# Patient Record
Sex: Female | Born: 1990 | Race: Black or African American | Hispanic: No | Marital: Single | Smoking: Current every day smoker
Health system: Southern US, Community
[De-identification: ages and names within clinical notes are randomized; demographics above are authoritative.]

## PROBLEM LIST (undated history)

## (undated) ENCOUNTER — Inpatient Hospital Stay (HOSPITAL_COMMUNITY): Payer: Self-pay

## (undated) DIAGNOSIS — N390 Urinary tract infection, site not specified: Secondary | ICD-10-CM

## (undated) DIAGNOSIS — R000 Tachycardia, unspecified: Secondary | ICD-10-CM

## (undated) DIAGNOSIS — D649 Anemia, unspecified: Secondary | ICD-10-CM

## (undated) DIAGNOSIS — R51 Headache: Secondary | ICD-10-CM

## (undated) DIAGNOSIS — E669 Obesity, unspecified: Secondary | ICD-10-CM

## (undated) DIAGNOSIS — K5900 Constipation, unspecified: Secondary | ICD-10-CM

## (undated) HISTORY — PX: FOOT SURGERY: SHX648

## (undated) HISTORY — PX: TONSILLECTOMY: SUR1361

---
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
## 2012-03-11 ENCOUNTER — Emergency Department (HOSPITAL_BASED_OUTPATIENT_CLINIC_OR_DEPARTMENT_OTHER)
Admission: EM | Admit: 2012-03-11 | Discharge: 2012-03-11 | Disposition: A | Discharge status: Home / Self Care | Payer: Self-pay | Attending: Emergency Medicine | Admitting: Emergency Medicine

## 2012-03-11 ENCOUNTER — Encounter (HOSPITAL_BASED_OUTPATIENT_CLINIC_OR_DEPARTMENT_OTHER): Payer: Self-pay | Admitting: Emergency Medicine

## 2012-03-11 DIAGNOSIS — R21 Rash and other nonspecific skin eruption: Secondary | ICD-10-CM | POA: Insufficient documentation

## 2012-03-11 DIAGNOSIS — T7840XA Allergy, unspecified, initial encounter: Secondary | ICD-10-CM

## 2012-03-11 DIAGNOSIS — Y9389 Activity, other specified: Secondary | ICD-10-CM | POA: Insufficient documentation

## 2012-03-11 DIAGNOSIS — Y92009 Unspecified place in unspecified non-institutional (private) residence as the place of occurrence of the external cause: Secondary | ICD-10-CM | POA: Insufficient documentation

## 2012-03-11 DIAGNOSIS — T550X1A Toxic effect of soaps, accidental (unintentional), initial encounter: Secondary | ICD-10-CM | POA: Insufficient documentation

## 2012-03-11 DIAGNOSIS — L299 Pruritus, unspecified: Secondary | ICD-10-CM | POA: Insufficient documentation

## 2012-03-11 MED ORDER — PERMETHRIN 5 % EX CREA: TOPICAL_CREAM | CUTANEOUS | Status: DC

## 2012-03-11 MED ORDER — PREDNISONE 20 MG PO TABS: 40.0000 mg | ORAL_TABLET | Freq: Every day | ORAL | Status: DC

## 2012-03-11 MED ORDER — PREDNISONE 20 MG PO TABS
40.0000 mg | ORAL_TABLET | Freq: Once | ORAL | Status: AC
  Administered 2012-03-11: 40 mg via ORAL
  Filled 2012-03-11: qty 2

## 2012-03-11 NOTE — ED Notes (Signed)
Pt has rash to arms and lower legs since Thursday.  Few areas on shoulder and abdomen.  Pt states the rash itches severely, especially at night.

## 2012-03-11 NOTE — ED Provider Notes (Signed)
History     CSN: [id number]  Arrival date & time 03/11/12  4098   First MD Initiated Contact with Patient 03/11/12 [phone]      Chief Complaint  Patient presents with  . Rash    (Consider location/radiation/quality/duration/timing/severity/associated sxs/prior treatment) HPI Comments: The patient is a 22 year old female who presents with a complaint of rash. She states that on Thursday after using a new laundry detergent that her mother's house she wore closed that had been recently washed and then laundry detergent. Soon thereafter she developed a rash on her upper arms or her teacher was, this had become worse, has spread over her body and includes lesions on her hands between her fingers, on her abdomen, a couple of spots on her legs and her upper chest. These are very itchy, they have scabbed over but she states that she has been scratching at them. She denies fevers chills and medications, any lesions inside her mouth, no swelling and no difficulty breathing. The itching got better with Benadryl but the rash has been present  Patient is a 22 y.o. female presenting with rash. The history is provided by the patient.  Rash   No past medical history on file.  Past Surgical History  Procedure Laterality Date  . Foot surgery    . Tonsillectomy      No family history on file.  History  Substance Use Topics  . Smoking status: Never Smoker   . Smokeless tobacco: Not on file  . Alcohol Use: No    OB History   Grav Para Term Preterm Abortions TAB SAB Ect Mult Living                  Review of Systems  Skin: Positive for rash.  All other systems reviewed and are negative.    Allergies  Review of patient's allergies indicates no known allergies.  Home Medications   Current Outpatient Rx  Name  Route  Sig  Dispense  Refill  . permethrin (ELIMITE) 5 % cream      Apply to entire body other than face - let sit for 14 hours then wash off, may repeat in 1 week if still having  symptoms   60 g   1   . predniSONE (DELTASONE) 20 MG tablet   Oral   Take 2 tablets (40 mg total) by mouth daily.   10 tablet   0     BP 141/82  Pulse 98  Temp(Src) 98.5 F (36.9 C) (Oral)  Resp 16  Ht 5\' 9"  (1.753 m)  Wt 235 lb (106.595 kg)  BMI 34.69 kg/m2  SpO2 100%  LMP 03/11/2012  Physical Exam  Nursing note and vitals reviewed. Constitutional: She appears well-developed and well-nourished. No distress.  HENT:  Head: Normocephalic and atraumatic.  Mouth/Throat: Oropharynx is clear and moist. No oropharyngeal exudate.  Eyes: Conjunctivae and EOM are normal. Pupils are equal, round, and reactive to light. Right eye exhibits no discharge. Left eye exhibits no discharge. No scleral icterus.  Neck: Normal range of motion. Neck supple. No JVD present. No thyromegaly present.  Cardiovascular: Normal rate, regular rhythm, normal heart sounds and intact distal pulses.  Exam reveals no gallop and no friction rub.   No murmur heard. Pulmonary/Chest: Effort normal and breath sounds normal. No respiratory distress. She has no wheezes. She has no rales.  Abdominal: Soft. Bowel sounds are normal. She exhibits no distension and no mass. There is no tenderness.  Musculoskeletal: Normal range of  motion. She exhibits no edema and no tenderness.  Lymphadenopathy:    She has no cervical adenopathy.  Neurological: She is alert. Coordination normal.  Skin: Skin is warm and dry. Rash noted. No erythema.  Erythematous, pruritic, blanching, scabbing rash that is over her bilateral upper extremities, anterior trunk and shoulders and intertriginous spaces between her fingers. There does appear to be in mild urticaria but also individual papules that are scabbed over.  Psychiatric: She has a normal mood and affect. Her behavior is normal.    ED Course  Procedures (including critical care time)  Labs Reviewed - No data to display No results found.   1. 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  Treatment for prednisone and permethrin cream, can followup as indicated        Vida Roller, MD 03/11/12 [phone]

## 2012-04-03 ENCOUNTER — Encounter (HOSPITAL_BASED_OUTPATIENT_CLINIC_OR_DEPARTMENT_OTHER): Payer: Self-pay | Admitting: *Deleted

## 2012-04-03 ENCOUNTER — Emergency Department (HOSPITAL_BASED_OUTPATIENT_CLINIC_OR_DEPARTMENT_OTHER)
Admission: EM | Admit: 2012-04-03 | Discharge: 2012-04-03 | Discharge status: Against Medical Advice | Payer: Self-pay | Attending: Emergency Medicine | Admitting: Emergency Medicine

## 2012-04-03 DIAGNOSIS — S0993XA Unspecified injury of face, initial encounter: Secondary | ICD-10-CM | POA: Insufficient documentation

## 2012-04-03 DIAGNOSIS — Z5321 Procedure and treatment not carried out due to patient leaving prior to being seen by health care provider: Secondary | ICD-10-CM

## 2012-04-03 NOTE — ED Provider Notes (Signed)
History     CSN: [id number]  Arrival date & time 04/03/12  5284   None     Chief Complaint  Patient presents with  . Facial Injury    (Consider location/radiation/quality/duration/timing/severity/associated sxs/prior treatment) HPI  History reviewed. No pertinent past medical history.  Past Surgical History  Procedure Laterality Date  . Foot surgery    . Tonsillectomy      No family history on file.  History  Substance Use Topics  . Smoking status: Current Every Day Smoker -- 1.00 packs/day    Types: Cigarettes  . Smokeless tobacco: Not on file  . Alcohol Use: No    OB History   Grav Para Term Preterm Abortions TAB SAB Ect Mult Living                  Review of Systems  Allergies  Review of patient's allergies indicates no known allergies.  Home Medications   Current Outpatient Rx  Name  Route  Sig  Dispense  Refill  . permethrin (ELIMITE) 5 % cream      Apply to entire body other than face - let sit for 14 hours then wash off, may repeat in 1 week if still having symptoms   60 g   1   . predniSONE (DELTASONE) 20 MG tablet   Oral   Take 2 tablets (40 mg total) by mouth daily.   10 tablet   0     BP 144/87  Pulse 130  Temp(Src) 98.9 F (37.2 C) (Oral)  Resp 24  SpO2 98%  LMP 03/01/2012  Physical Exam  ED Course  Procedures (including critical care time)  Labs Reviewed - No data to display No results found.   1. Patient left without being seen       Carleene Cooper III, MD 04/03/12 1126
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
## 2012-04-03 NOTE — ED Notes (Signed)
Pt mother angry, states "If the doctor isn't in here within a few minutes we are leaving!" while this rn is notifying dr. Ignacia Palma, pt and mother are seen leaving ambulatory through the front doors by rt.

## 2012-04-03 NOTE — ED Notes (Signed)
Ice pack placed on pt face.

## 2012-11-15 ENCOUNTER — Inpatient Hospital Stay (HOSPITAL_COMMUNITY)
Admission: AD | Admit: 2012-11-15 | Discharge: 2012-11-15 | Disposition: A | Discharge status: Home / Self Care | Payer: Medicaid Other | Source: Ambulatory Visit | Attending: Family Medicine | Admitting: Family Medicine

## 2012-11-15 ENCOUNTER — Encounter (HOSPITAL_COMMUNITY): Payer: Self-pay | Admitting: *Deleted

## 2012-11-15 ENCOUNTER — Inpatient Hospital Stay (HOSPITAL_COMMUNITY): Payer: Self-pay

## 2012-11-15 DIAGNOSIS — B3731 Acute candidiasis of vulva and vagina: Secondary | ICD-10-CM | POA: Insufficient documentation

## 2012-11-15 DIAGNOSIS — O2390 Unspecified genitourinary tract infection in pregnancy, unspecified trimester: Secondary | ICD-10-CM | POA: Insufficient documentation

## 2012-11-15 DIAGNOSIS — B373 Candidiasis of vulva and vagina: Secondary | ICD-10-CM

## 2012-11-15 DIAGNOSIS — R109 Unspecified abdominal pain: Secondary | ICD-10-CM | POA: Insufficient documentation

## 2012-11-15 DIAGNOSIS — N949 Unspecified condition associated with female genital organs and menstrual cycle: Secondary | ICD-10-CM | POA: Insufficient documentation

## 2012-11-15 HISTORY — DX: Tachycardia, unspecified: R00.0

## 2012-11-15 LAB — WET PREP, GENITAL: Clue Cells Wet Prep HPF POC: NONE SEEN

## 2012-11-15 LAB — CBC
MCH: 24.2 pg — ABNORMAL LOW (ref 26.0–34.0)
MCHC: 32.6 g/dL (ref 30.0–36.0)
Platelets: 298 10*3/uL (ref 150–400)
RBC: 4.42 MIL/uL (ref 3.87–5.11)

## 2012-11-15 LAB — URINALYSIS, ROUTINE W REFLEX MICROSCOPIC
Bilirubin Urine: NEGATIVE
Leukocytes, UA: NEGATIVE
Nitrite: NEGATIVE
Specific Gravity, Urine: 1.02 (ref 1.005–1.030)
Urobilinogen, UA: 0.2 mg/dL (ref 0.0–1.0)
pH: 6.5 (ref 5.0–8.0)

## 2012-11-15 LAB — HCG, QUANTITATIVE, PREGNANCY: hCG, Beta Chain, Quant, S: 12657 m[IU]/mL — ABNORMAL HIGH (ref <5)

## 2012-11-15 LAB — POCT PREGNANCY, URINE: Preg Test, Ur: POSITIVE — AB

## 2012-11-15 MED ORDER — FLUCONAZOLE 150 MG PO TABS: 150.0000 mg | ORAL_TABLET | Freq: Once | ORAL | Status: DC

## 2012-11-15 MED ORDER — FLUCONAZOLE 150 MG PO TABS
150.0000 mg | ORAL_TABLET | ORAL | Status: AC
  Administered 2012-11-15: 150 mg via ORAL
  Filled 2012-11-15: qty 1

## 2012-11-15 NOTE — MAU Note (Signed)
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      LAST SEX-  9-30-   NO BIRTH CONTROL.     DID HPT- 10-27-  POSTIVE  THEN DID ANOTHER  THAT EVENING - NEGATIVE.

## 2012-11-15 NOTE — MAU Provider Note (Signed)
Chief Complaint: No chief complaint on file.   First Provider Initiated Contact with Patient 11/15/12 2055     SUBJECTIVE HPI: Nil Joann Fernandez is a 22 y.o. G4P3 at [redacted]w[redacted]d by LMP who presents to maternity admissions reporting cramping lower abdominal pain, mostly on the right starting 2 days ago.  She took a pregnancy test 3 weeks ago which was equivocal but has not taken another test.  Her test in MAU today is positive.  She denies vaginal bleeding, vaginal itching/burning, urinary symptoms, h/a, dizziness, n/v, or fever/chills.   Past Medical History  Diagnosis Date  . Tachycardia    Past Surgical History  Procedure Laterality Date  . Foot surgery    . Tonsillectomy     History   Social History  . Marital Status: Single    Spouse Name: N/A    Number of Children: N/A  . Years of Education: N/A   Occupational History  . Not on file.   Social History Main Topics  . Smoking status: Current Every Day Smoker -- 1.00 packs/day    Types: Cigarettes  . Smokeless tobacco: Not on file  . Alcohol Use: No  . Drug Use: No  . Sexual Activity: Yes    Birth Control/ Protection: None   Other Topics Concern  . Not on file   Social History Narrative  . No narrative on file   No current facility-administered medications on file prior to encounter.   No current outpatient prescriptions on file prior to encounter.   No Known Allergies  ROS: Pertinent items in HPI  OBJECTIVE Blood pressure 120/64, pulse 110, temperature 98.8 F (37.1 C), temperature source Oral, resp. rate 20, height 5\' 7"  (1.702 m), weight 110.224 kg (243 lb), last menstrual period 10/07/2012. GENERAL: Well-developed, well-nourished female in no acute distress.  HEENT: Normocephalic HEART: normal rate RESP: normal effort ABDOMEN: Soft, non-tender, no rebound tenderness, no guarding EXTREMITIES: Nontender, no edema NEURO: Alert and oriented Pelvic exam: Cervix pink, visually closed, without lesion, large amount  thick white clumps of discharge clinging to vaginal walls and cervix, vaginal walls and external genitalia normal Bimanual exam: Cervix 0/long/high, firm, anterior, neg CMT, uterus nontender, nonenlarged, adnexa without tenderness, enlargement, or mass  LAB RESULTS Results for orders placed during the hospital encounter of 11/15/12 (from the past 24 hour(s))  URINALYSIS, ROUTINE W REFLEX MICROSCOPIC     Status: None   Collection Time    11/15/12  7:41 PM      Result Value Range   Color, Urine YELLOW  YELLOW   APPearance CLEAR  CLEAR   Specific Gravity, Urine 1.020  1.005 - 1.030   pH 6.5  5.0 - 8.0   Glucose, UA NEGATIVE  NEGATIVE mg/dL   Hgb urine dipstick NEGATIVE  NEGATIVE   Bilirubin Urine NEGATIVE  NEGATIVE   Ketones, ur NEGATIVE  NEGATIVE mg/dL   Protein, ur NEGATIVE  NEGATIVE mg/dL   Urobilinogen, UA 0.2  0.0 - 1.0 mg/dL   Nitrite NEGATIVE  NEGATIVE   Leukocytes, UA NEGATIVE  NEGATIVE  POCT PREGNANCY, URINE     Status: Abnormal   Collection Time    11/15/12  8:09 PM      Result Value Range   Preg Test, Ur POSITIVE (*) NEGATIVE  CBC     Status: Abnormal   Collection Time    11/15/12  8:38 PM      Result Value Range   WBC 15.8 (*) 4.0 - 10.5 K/uL   RBC 4.42  3.87 -  5.11 MIL/uL   Hemoglobin 10.7 (*) 12.0 - 15.0 g/dL   HCT 52.8 (*) 41.3 - 24.4 %   MCV 74.2 (*) 78.0 - 100.0 fL   MCH 24.2 (*) 26.0 - 34.0 pg   MCHC 32.6  30.0 - 36.0 g/dL   RDW 01.0  27.2 - 53.6 %   Platelets 298  150 - 400 K/uL  HCG, QUANTITATIVE, PREGNANCY     Status: Abnormal   Collection Time    11/15/12  8:38 PM      Result Value Range   hCG, Beta Chain, Quant, S 12657 (*) <5 mIU/mL  WET PREP, GENITAL     Status: Abnormal   Collection Time    11/15/12  9:00 PM      Result Value Range   Yeast Wet Prep HPF POC MODERATE (*) NONE SEEN   Trich, Wet Prep NONE SEEN  NONE SEEN   Clue Cells Wet Prep HPF POC NONE SEEN  NONE SEEN   WBC, Wet Prep HPF POC FEW (*) NONE SEEN   Ultrasound and quant hcg  pending  US Ob Comp Less 14 Wks  11/15/2012   CLINICAL DATA:  22-year- female with right side pelvic pain. Estimated gestational age by LMP 5 weeks and 4 days.  EXAM: OBSTETRIC <14 WK [place] AND TRANSVAGINAL OB US  TECHNIQUE: Both transabdominal and transvaginal ultrasound examinations were performed for complete evaluation of the gestation as well as the maternal uterus, adnexal regions, and pelvic cul-de-sac. Transvaginal technique was performed to assess early pregnancy.  COMPARISON:  None.  FINDINGS: Intrauterine gestational sac: Single  Yolk sac:  Visible  Embryo:  Visible  Cardiac Activity: Detected  Heart Rate:  92 bpm  CRL:   2.8  mm   6 w 0 d                  [place] EDC: 07/11/2013  Maternal uterus/adnexae: Small volume subchorionic hemorrhage (image 25). No pelvic free fluid identified. Right side corpus luteum cyst suspected. Otherwise the ovaries appear normal.  IMPRESSION: Viable singleton intrauterine pregnancy with estimated gestational age of [redacted] weeks and 0 days by crown-rump length. Small volume subchorionic hemorrhage.   Electronically Signed   By: Augusto Gamble M.D.   On: 11/15/2012 22:03   US Ob Transvaginal  11/15/2012   CLINICAL DATA:  22-year- female with right side pelvic pain. Estimated gestational age by LMP 5 weeks and 4 days.  EXAM: OBSTETRIC <14 WK [place] AND TRANSVAGINAL OB US  TECHNIQUE: Both transabdominal and transvaginal ultrasound examinations were performed for complete evaluation of the gestation as well as the maternal uterus, adnexal regions, and pelvic cul-de-sac. Transvaginal technique was performed to assess early pregnancy.  COMPARISON:  None.  FINDINGS: Intrauterine gestational sac: Single  Yolk sac:  Visible  Embryo:  Visible  Cardiac Activity: Detected  Heart Rate:  92 bpm  CRL:   2.8  mm   6 w 0 d                  [place] EDC: 07/11/2013  Maternal uterus/adnexae: Small volume subchorionic hemorrhage (image 25). No pelvic free fluid identified. Right side corpus luteum cyst suspected.  Otherwise the ovaries appear normal.  IMPRESSION: Viable singleton intrauterine pregnancy with estimated gestational age of [redacted] weeks and 0 days by crown-rump length. Small volume subchorionic hemorrhage.   Electronically Signed   By: Augusto Gamble M.D.   On: 11/15/2012 22:03   A/P:  1. Candidal vaginitis   2.  Pelvic pain complicating pregnancy, antepartum, first trimester    RX for diflucan  Start [facility] as soon as possible Return to MAU as needed   Joann Fernandez   Report to Thressa Sheller, CNM   Sharen Counter Certified Nurse-Midwife 11/15/2012  10:18 PM

## 2012-11-15 NOTE — MAU Note (Signed)
See triage note. Patient denies vaginal bleeding or abnormal vaginal discharge as well.

## 2012-11-20 NOTE — MAU Provider Note (Signed)
Attestation of Attending Supervision of Advanced Practitioner (CNM/NP): Evaluation and management procedures were performed by the Advanced Practitioner under my supervision and collaboration.  I have reviewed the Advanced Practitioner's note and chart, and I agree with the management and plan.  Greg Cratty 11/20/2012 4:25 PM   

## 2013-01-30 ENCOUNTER — Encounter: Payer: Self-pay | Admitting: Advanced Practice Midwife

## 2013-01-30 ENCOUNTER — Ambulatory Visit (INDEPENDENT_AMBULATORY_CARE_PROVIDER_SITE_OTHER): Payer: Self-pay | Admitting: Advanced Practice Midwife

## 2013-01-30 VITALS — BP 131/71 | Temp 97.1°F | Wt 244.6 lb

## 2013-01-30 DIAGNOSIS — O0930 Supervision of pregnancy with insufficient antenatal care, unspecified trimester: Secondary | ICD-10-CM | POA: Insufficient documentation

## 2013-01-30 DIAGNOSIS — R51 Headache: Secondary | ICD-10-CM

## 2013-01-30 DIAGNOSIS — Z3490 Encounter for supervision of normal pregnancy, unspecified, unspecified trimester: Secondary | ICD-10-CM

## 2013-01-30 DIAGNOSIS — Z23 Encounter for immunization: Secondary | ICD-10-CM

## 2013-01-30 DIAGNOSIS — R519 Headache, unspecified: Secondary | ICD-10-CM

## 2013-01-30 DIAGNOSIS — O0932 Supervision of pregnancy with insufficient antenatal care, second trimester: Secondary | ICD-10-CM

## 2013-01-30 LAB — POCT URINALYSIS DIP (DEVICE)
BILIRUBIN URINE: NEGATIVE
Glucose, UA: NEGATIVE mg/dL
Hgb urine dipstick: NEGATIVE
Ketones, ur: NEGATIVE mg/dL
LEUKOCYTES UA: NEGATIVE
NITRITE: NEGATIVE
PH: 6 (ref 5.0–8.0)
PROTEIN: NEGATIVE mg/dL
Specific Gravity, Urine: 1.025 (ref 1.005–1.030)
UROBILINOGEN UA: 0.2 mg/dL (ref 0.0–1.0)

## 2013-01-30 MED ORDER — BUTALBITAL-APAP-CAFFEINE 50-325-40 MG PO TABS: 1.0000 | ORAL_TABLET | Freq: Four times a day (QID) | ORAL | Status: DC | PRN

## 2013-01-30 NOTE — Progress Notes (Signed)
New OB. See smart set note   Subjective:    Joann Fernandez is a [id number] 742w3d being seen today for her first obstetrical visit.  Her obstetrical history is significant for obesity and late to care. Patient does intend to breast feed. Pregnancy history fully reviewed.  Patient reports headache and plans [facility] [place].  Filed Vitals:   01/30/13 0813  BP: 131/71  Temp: 97.1 F (36.2 C)  Weight: 110.95 kg (244 lb 9.6 oz)    HISTORY: OB History  Gravida Para Term Preterm AB SAB TAB Ectopic Multiple Living  4 3 3       3     # Outcome Date GA Lbr Len/2nd Weight Sex Delivery Anes PTL Lv  4 CUR           3 TRM 2010     SVD        Comments: Vasa Previa and Oligo  2 TRM 2009     SVD     1 TRM 2008     SVD        Past Medical History  Diagnosis Date  . Tachycardia   . Medical history non-contributory    Past Surgical History  Procedure Laterality Date  . Foot surgery    . Tonsillectomy     Family History  Problem Relation Age of Onset  . Diabetes Father   . Asthma Brother   . Hearing loss Son   . Hypertension Maternal Grandmother   . Stroke Maternal Grandmother      Exam    Uterus:  Fundal Height: 16 cm  Pelvic Exam:    Perineum: No Hemorrhoids   Vulva: Bartholin's, Urethra, Skene's normal   Vagina:  normal mucosa, normal discharge   pH:    Cervix: multiparous appearance and no cervical motion tenderness   Adnexa: normal adnexa and no mass, fullness, tenderness   Bony Pelvis: gynecoid  System: Breast:  normal appearance, no masses or tenderness   Skin: normal coloration and turgor, no rashes    Neurologic: oriented, grossly non-focal   Extremities: normal strength, tone, and muscle mass   HEENT neck supple with midline trachea   Mouth/Teeth mucous membranes moist, pharynx normal without lesions   Neck supple and no masses   Cardiovascular: regular rate and rhythm   Respiratory:  appears well, vitals normal, no respiratory distress, acyanotic,  normal RR, ear and throat exam is normal, neck free of mass or lymphadenopathy, chest clear, no wheezing, crepitations, rhonchi, normal symmetric air entry   Abdomen: soft, non-tender; bowel sounds normal; no masses,  no organomegaly   Urinary: urethral meatus normal      Assessment:    Pregnancy: [id number] Patient Active Problem List   Diagnosis Date Noted  . Late prenatal care complicating pregnancy in second trimester 01/30/2013  SIUP at 2642w3d        Plan:     Initial labs drawn. Prenatal vitamins. Problem list reviewed and updated. Genetic Screening discussed Quad Screen: ordered.  Ultrasound discussed; fetal survey: ordered.  Follow up in 4 weeks. 50% of 30 min visit spent on counseling and coordination of care.   Declines vaccines    [facility] HospitalWILLIAMS,Lexine Jaspers 01/30/2013

## 2013-01-30 NOTE — Addendum Note (Signed)
Addended by: Aviva SignsWILLIAMS, Phat Dalton L on: 01/30/2013 09:08 AM   Modules accepted: Orders

## 2013-01-30 NOTE — Patient Instructions (Signed)
Second Trimester of Pregnancy The second trimester is from week 13 through week 28, months 4 through 6. The second trimester is often a time when you feel your best. Your body has also adjusted to being pregnant, and you begin to feel better physically. Usually, morning sickness has lessened or quit completely, you may have more energy, and you may have an increase in appetite. The second trimester is also a time when the fetus is growing rapidly. At the end of the sixth month, the fetus is about 9 inches long and weighs about 1 pounds. You will likely begin to feel the baby move (quickening) between 18 and 20 weeks of the pregnancy. BODY CHANGES Your body goes through many changes during pregnancy. The changes vary from woman to woman.   Your weight will continue to increase. You will notice your lower abdomen bulging out.  You may begin to get stretch marks on your hips, abdomen, and breasts.  You may develop headaches that can be relieved by medicines approved by your caregiver.  You may urinate more often because the fetus is pressing on your bladder.  You may develop or continue to have heartburn as a result of your pregnancy.  You may develop constipation because certain hormones are causing the muscles that push waste through your intestines to slow down.  You may develop hemorrhoids or swollen, bulging veins (varicose veins).  You may have back pain because of the weight gain and pregnancy hormones relaxing your joints between the bones in your pelvis and as a result of a shift in weight and the muscles that support your balance.  Your breasts will continue to grow and be tender.  Your gums may bleed and may be sensitive to brushing and flossing.  Dark spots or blotches (chloasma, mask of pregnancy) may develop on your face. This will likely fade after the baby is born.  A dark line from your belly button to the pubic area (linea nigra) may appear. This will likely fade after the  baby is born. WHAT TO EXPECT AT YOUR PRENATAL VISITS During a routine prenatal visit:  You will be weighed to make sure you and the fetus are growing normally.  Your blood pressure will be taken.  Your abdomen will be measured to track your baby's growth.  The fetal heartbeat will be listened to.  Any test results from the previous visit will be discussed. Your caregiver may ask you:  How you are feeling.  If you are feeling the baby move.  If you have had any abnormal symptoms, such as leaking fluid, bleeding, severe headaches, or abdominal cramping.  If you have any questions. Other tests that may be performed during your second trimester include:  Blood tests that check for:  Low iron levels (anemia).  Gestational diabetes (between 24 and 28 weeks).  Rh antibodies.  Urine tests to check for infections, diabetes, or protein in the urine.  An ultrasound to confirm the proper growth and development of the baby.  An amniocentesis to check for possible genetic problems.  Fetal screens for spina bifida and Down syndrome. HOME CARE INSTRUCTIONS   Avoid all smoking, herbs, alcohol, and unprescribed drugs. These chemicals affect the formation and growth of the baby.  Follow your caregiver's instructions regarding medicine use. There are medicines that are either safe or unsafe to take during pregnancy.  Exercise only as directed by your caregiver. Experiencing uterine cramps is a good sign to stop exercising.  Continue to eat regular,   healthy meals.  Wear a good support bra for breast tenderness.  Do not use hot tubs, steam rooms, or saunas.  Wear your seat belt at all times when driving.  Avoid raw meat, uncooked cheese, cat litter boxes, and soil used by cats. These carry germs that can cause birth defects in the baby.  Take your prenatal vitamins.  Try taking a stool softener (if your caregiver approves) if you develop constipation. Eat more high-fiber foods,  such as fresh vegetables or fruit and whole grains. Drink plenty of fluids to keep your urine clear or pale yellow.  Take warm sitz baths to soothe any pain or discomfort caused by hemorrhoids. Use hemorrhoid cream if your caregiver approves.  If you develop varicose veins, wear support hose. Elevate your feet for 15 minutes, 3 4 times a day. Limit salt in your diet.  Avoid heavy lifting, wear low heel shoes, and practice good posture.  Rest with your legs elevated if you have leg cramps or low back pain.  Visit your dentist if you have not gone yet during your pregnancy. Use a soft toothbrush to brush your teeth and be gentle when you floss.  A sexual relationship may be continued unless your caregiver directs you otherwise.  Continue to go to all your prenatal visits as directed by your caregiver. SEEK MEDICAL CARE IF:   You have dizziness.  You have mild pelvic cramps, pelvic pressure, or nagging pain in the abdominal area.  You have persistent nausea, vomiting, or diarrhea.  You have a bad smelling vaginal discharge.  You have pain with urination. SEEK IMMEDIATE MEDICAL CARE IF:   You have a fever.  You are leaking fluid from your vagina.  You have spotting or bleeding from your vagina.  You have severe abdominal cramping or pain.  You have rapid weight gain or loss.  You have shortness of breath with chest pain.  You notice sudden or extreme swelling of your face, hands, ankles, feet, or legs.  You have not felt your baby move in over an hour.  You have severe headaches that do not go away with medicine.  You have vision changes. Document Released: 12/21/2000 Document Revised: 08/29/2012 Document Reviewed: 02/28/2012 [employer] Patient Information 2014 [employer].  

## 2013-01-30 NOTE — Addendum Note (Signed)
Addended by: Franchot MimesALFARO, Khadijah Mastrianni on: 01/30/2013 09:31 AM   Modules accepted: Orders

## 2013-01-30 NOTE — Progress Notes (Signed)
P=[id number]  Initial prenatal visit

## 2013-01-31 LAB — OBSTETRIC PANEL
Antibody Screen: NEGATIVE
BASOS ABS: 0 10*3/uL (ref 0.0–0.1)
Basophils Relative: 0 % (ref 0–1)
Eosinophils Absolute: 0.3 10*3/uL (ref 0.0–0.7)
Eosinophils Relative: 2 % (ref 0–5)
HCT: 31.5 % — ABNORMAL LOW (ref 36.0–46.0)
HEP B S AG: NEGATIVE
Hemoglobin: 10.3 g/dL — ABNORMAL LOW (ref 12.0–15.0)
LYMPHS ABS: 3.7 10*3/uL (ref 0.7–4.0)
LYMPHS PCT: 21 % (ref 12–46)
MCH: 25.2 pg — ABNORMAL LOW (ref 26.0–34.0)
MCHC: 32.7 g/dL (ref 30.0–36.0)
MCV: 77 fL — ABNORMAL LOW (ref 78.0–100.0)
Monocytes Absolute: 1.4 10*3/uL — ABNORMAL HIGH (ref 0.1–1.0)
Monocytes Relative: 8 % (ref 3–12)
NEUTROS ABS: 12.4 10*3/uL — AB (ref 1.7–7.7)
NEUTROS PCT: 69 % (ref 43–77)
PLATELETS: 273 10*3/uL (ref 150–400)
RBC: 4.09 MIL/uL (ref 3.87–5.11)
RDW: 16.7 % — AB (ref 11.5–15.5)
RUBELLA: 8.08 {index} — AB (ref <0.90)
Rh Type: POSITIVE
WBC: 17.8 10*3/uL — AB (ref 4.0–10.5)

## 2013-01-31 LAB — PRESCRIPTION MONITORING PROFILE (19 PANEL)
AMPHETAMINE/METH: NEGATIVE ng/mL
BARBITURATE SCREEN, URINE: NEGATIVE ng/mL
BUPRENORPHINE, URINE: NEGATIVE ng/mL
Benzodiazepine Screen, Urine: NEGATIVE ng/mL
CANNABINOID SCRN UR: NEGATIVE ng/mL
CARISOPRODOL, URINE: NEGATIVE ng/mL
Cocaine Metabolites: NEGATIVE ng/mL
Creatinine, Urine: 197.65 mg/dL (ref >20.0)
Fentanyl, Ur: NEGATIVE ng/mL
MDMA URINE: NEGATIVE ng/mL
METHADONE SCREEN, URINE: NEGATIVE ng/mL
Meperidine, Ur: NEGATIVE ng/mL
Methaqualone: NEGATIVE ng/mL
NITRITES URINE, INITIAL: NEGATIVE ug/mL
OPIATE SCREEN, URINE: NEGATIVE ng/mL
OXYCODONE SCRN UR: NEGATIVE ng/mL
PHENCYCLIDINE, UR: NEGATIVE ng/mL
PROPOXYPHENE: NEGATIVE ng/mL
TAPENTADOLUR: NEGATIVE ng/mL
Tramadol Scrn, Ur: NEGATIVE ng/mL
Zolpidem, Urine: NEGATIVE ng/mL
pH, Initial: 5.9 pH (ref 4.5–8.9)

## 2013-01-31 LAB — HIV ANTIBODY (ROUTINE TESTING W REFLEX): HIV: NONREACTIVE

## 2013-01-31 LAB — GLUCOSE TOLERANCE, 1 HOUR (50G) W/O FASTING: Glucose, 1 Hour GTT: 159 mg/dL — ABNORMAL HIGH (ref 70–140)

## 2013-01-31 LAB — GC/CHLAMYDIA PROBE AMP
CT Probe RNA: NEGATIVE
GC PROBE AMP APTIMA: NEGATIVE

## 2013-02-01 ENCOUNTER — Encounter: Payer: Self-pay | Admitting: Advanced Practice Midwife

## 2013-02-01 LAB — AFP, QUAD SCREEN
AFP: 30.3 [IU]/mL
Age Alone: 1:1160 {titer}
CURR GEST AGE: 16.6 wks.days
HCG TOTAL: 20165 m[IU]/mL
INH: 264.4 pg/mL
INTERPRETATION-AFP: NEGATIVE
MOM FOR AFP: 1.14
MoM for INH: 1.87
MoM for hCG: 1.21
OPEN SPINA BIFIDA: NEGATIVE
Osb Risk: 1:16900 {titer}
Tri 18 Scr Risk Est: NEGATIVE
Trisomy 18 (Edward) Syndrome Interp.: 1:123000 {titer}
UE3 MOM: 1.03
UE3 VALUE: 0.5 ng/mL

## 2013-02-01 LAB — HEMOGLOBINOPATHY EVALUATION
HGB F QUANT: 0 % (ref 0.0–2.0)
Hemoglobin Other: 0 %
Hgb A2 Quant: 2.7 % (ref 2.2–3.2)
Hgb A: 97.3 % (ref 96.8–97.8)
Hgb S Quant: 0 %

## 2013-02-01 LAB — CULTURE, OB URINE

## 2013-02-04 ENCOUNTER — Telehealth: Payer: Self-pay

## 2013-02-04 ENCOUNTER — Encounter: Payer: Self-pay | Admitting: Advanced Practice Midwife

## 2013-02-04 NOTE — Telephone Encounter (Signed)
Message copied by Louanna RawAMPBELL, Ranyia Witting M on Mon Feb 04, 2013 12:55 PM ------      Message from: [place] Heights[place]ILLIAMS, [place] L      Created: Mon Feb 04, 2013  8:19 AM      Regarding: glucola       Not sure if I messaged you already            Glucola 159, needs 3 hr test            Hilda LiasMarie ------

## 2013-02-04 NOTE — Telephone Encounter (Signed)
Luceal called back . I returned her call and notified her that her 1 hr gtt was too high and we need to schedule a 3 hr gtt- explained to her she will be fasting and procedure.  She agreed to appt for 02/06/13 at 8am for  3hr gtt.

## 2013-02-04 NOTE — Telephone Encounter (Signed)
Called pt No answer. Left message stating we are calling with results and to schedule an appointment for another glucose test, please call clinic.

## 2013-02-06 ENCOUNTER — Other Ambulatory Visit: Payer: Self-pay

## 2013-02-11 ENCOUNTER — Encounter (HOSPITAL_COMMUNITY): Payer: Self-pay | Admitting: *Deleted

## 2013-02-11 ENCOUNTER — Inpatient Hospital Stay (HOSPITAL_COMMUNITY)
Admission: AD | Admit: 2013-02-11 | Discharge: 2013-02-12 | Disposition: A | Discharge status: Home / Self Care | Payer: Medicaid Other | Source: Ambulatory Visit | Attending: Emergency Medicine | Admitting: Emergency Medicine

## 2013-02-11 ENCOUNTER — Inpatient Hospital Stay (HOSPITAL_COMMUNITY): Payer: Medicaid Other

## 2013-02-11 DIAGNOSIS — O9989 Other specified diseases and conditions complicating pregnancy, childbirth and the puerperium: Secondary | ICD-10-CM

## 2013-02-11 DIAGNOSIS — R52 Pain, unspecified: Secondary | ICD-10-CM

## 2013-02-11 DIAGNOSIS — R197 Diarrhea, unspecified: Secondary | ICD-10-CM | POA: Insufficient documentation

## 2013-02-11 DIAGNOSIS — D72829 Elevated white blood cell count, unspecified: Secondary | ICD-10-CM

## 2013-02-11 DIAGNOSIS — O0932 Supervision of pregnancy with insufficient antenatal care, second trimester: Secondary | ICD-10-CM

## 2013-02-11 DIAGNOSIS — O99210 Obesity complicating pregnancy, unspecified trimester: Secondary | ICD-10-CM

## 2013-02-11 DIAGNOSIS — O99330 Smoking (tobacco) complicating pregnancy, unspecified trimester: Secondary | ICD-10-CM | POA: Insufficient documentation

## 2013-02-11 DIAGNOSIS — IMO0002 Reserved for concepts with insufficient information to code with codable children: Secondary | ICD-10-CM | POA: Insufficient documentation

## 2013-02-11 DIAGNOSIS — R109 Unspecified abdominal pain: Secondary | ICD-10-CM

## 2013-02-11 DIAGNOSIS — O26899 Other specified pregnancy related conditions, unspecified trimester: Secondary | ICD-10-CM

## 2013-02-11 DIAGNOSIS — O99340 Other mental disorders complicating pregnancy, unspecified trimester: Secondary | ICD-10-CM | POA: Insufficient documentation

## 2013-02-11 DIAGNOSIS — E669 Obesity, unspecified: Secondary | ICD-10-CM | POA: Insufficient documentation

## 2013-02-11 DIAGNOSIS — R1084 Generalized abdominal pain: Secondary | ICD-10-CM | POA: Insufficient documentation

## 2013-02-11 HISTORY — DX: Obesity, unspecified: E66.9

## 2013-02-11 HISTORY — DX: Constipation, unspecified: K59.00

## 2013-02-11 LAB — COMPREHENSIVE METABOLIC PANEL
ALT: 15 U/L (ref 0–35)
AST: 15 U/L (ref 0–37)
Albumin: 3 g/dL — ABNORMAL LOW (ref 3.5–5.2)
Alkaline Phosphatase: 63 U/L (ref 39–117)
BUN: 5 mg/dL — ABNORMAL LOW (ref 6–23)
CALCIUM: 9 mg/dL (ref 8.4–10.5)
CO2: 21 mEq/L (ref 19–32)
CREATININE: 0.47 mg/dL — AB (ref 0.50–1.10)
Chloride: 103 mEq/L (ref 96–112)
GFR calc non Af Amer: >90 mL/min (ref >90)
GLUCOSE: 107 mg/dL — AB (ref 70–99)
Potassium: 3.9 mEq/L (ref 3.7–5.3)
SODIUM: 138 meq/L (ref 137–147)
TOTAL PROTEIN: 6.6 g/dL (ref 6.0–8.3)
Total Bilirubin: <0.2 mg/dL — ABNORMAL LOW (ref 0.3–1.2)

## 2013-02-11 LAB — CBC
HCT: 30.6 % — ABNORMAL LOW (ref 36.0–46.0)
HEMOGLOBIN: 10.4 g/dL — AB (ref 12.0–15.0)
MCH: 25.9 pg — AB (ref 26.0–34.0)
MCHC: 34 g/dL (ref 30.0–36.0)
MCV: 76.1 fL — AB (ref 78.0–100.0)
Platelets: 292 10*3/uL (ref 150–400)
RBC: 4.02 MIL/uL (ref 3.87–5.11)
RDW: 15.8 % — ABNORMAL HIGH (ref 11.5–15.5)
WBC: 22.1 10*3/uL — ABNORMAL HIGH (ref 4.0–10.5)

## 2013-02-11 LAB — LIPASE, BLOOD: LIPASE: 21 U/L (ref 11–59)

## 2013-02-11 LAB — AMYLASE: AMYLASE: 45 U/L (ref 0–105)

## 2013-02-11 MED ORDER — HYDROMORPHONE HCL PF 1 MG/ML IJ SOLN
1.0000 mg | Freq: Once | INTRAMUSCULAR | Status: AC
  Administered 2013-02-11: 1 mg via INTRAMUSCULAR
  Filled 2013-02-11: qty 1

## 2013-02-11 MED ORDER — HYDROMORPHONE HCL PF 1 MG/ML IJ SOLN
INTRAMUSCULAR | Status: AC
  Filled 2013-02-11: qty 1

## 2013-02-11 MED ORDER — OXYCODONE-ACETAMINOPHEN 5-325 MG PO TABS: 1.0000 | ORAL_TABLET | ORAL | Status: DC | PRN

## 2013-02-11 MED ORDER — HYDROMORPHONE HCL PF 1 MG/ML IJ SOLN: 1.0000 mg | Freq: Once | INTRAMUSCULAR | Status: DC

## 2013-02-11 NOTE — MAU Note (Signed)
Pt G4 P3 at 18.1wks having abd pain and pressure since 1630.  Denies bleeding or leaking. Yellow discharge this afternoon.

## 2013-02-11 NOTE — MAU Provider Note (Signed)
History     CSN: [id number]  Arrival date and time: 02/11/13 2008   First Provider Initiated Contact with Patient 02/11/13 2053      Chief Complaint  Patient presents with  . Abdominal Pain   HPI Comments: Joann Fernandez 23 y.o. [phone] presents to MAU in extreme pain. She states the pain started this afternoon and she feels like she has to 'poop' she took MOM and had several bowel movements. She says she is leaking yellow fluid. She is quite hysterical.  Abdominal Pain      Past Medical History  Diagnosis Date  . Tachycardia   . Medical history non-contributory   . Obese     Past Surgical History  Procedure Laterality Date  . Foot surgery    . Tonsillectomy      Family History  Problem Relation Age of Onset  . Diabetes Father   . Asthma Brother   . Hearing loss Son   . Hypertension Maternal Grandmother   . Stroke Maternal Grandmother     History  Substance Use Topics  . Smoking status: Current Every Day Smoker -- 1.00 packs/day    Types: Cigarettes  . Smokeless tobacco: Not on file  . Alcohol Use: No    Allergies: No Known Allergies  Prescriptions prior to admission  Medication Sig Dispense Refill  . acetaminophen (TYLENOL) 500 MG tablet Take 1,000 mg by mouth every 6 (six) hours as needed for headache.      . Prenatal Vit-Fe Fumarate-FA (PRENATAL MULTIVITAMIN) TABS tablet Take 1 tablet by mouth daily at 12 noon.        Review of Systems  Constitutional: Negative.   HENT: Negative.   Eyes: Negative.   Respiratory: Negative.   Cardiovascular: Negative.   Gastrointestinal: Positive for abdominal pain.  Genitourinary: Negative.   Musculoskeletal: Negative.   Skin: Negative.   Neurological: Negative.   Endo/Heme/Allergies: Negative.   Psychiatric/Behavioral: Negative.    Physical Exam   Blood pressure 143/87, pulse 129, temperature 98.3 F (36.8 C), temperature source Oral, resp. rate 20, height 5\' 9"  (1.753 m), weight 111.313 kg (245 lb 6.4  oz), last menstrual period 10/07/2012.  Physical Exam  Constitutional: She is oriented to person, place, and time. She appears well-developed and well-nourished. She appears distressed.  +FHT/ hysterical  HENT:  Head: Normocephalic and atraumatic.  Eyes: Pupils are equal, round, and reactive to light.  Neck: Normal range of motion.  Cardiovascular: Normal rate, regular rhythm and normal heart sounds.   Respiratory: Effort normal and breath sounds normal.  GI: Soft. Bowel sounds are normal. There is tenderness.  Genitourinary:  Genital:negative external Vaginal:thick white discharge/ no pooling. Negative fern Cervix:closed Bimanual:tender   Musculoskeletal: Normal range of motion.  Neurological: She is alert and oriented to person, place, and time.  Skin: Skin is warm.  Psychiatric: She is agitated.    MAU Course  Procedures  MDM  Crist FatFern Dilaudid 1 mg IM now U/S   Assessment and Plan   Transferred care to Joseph BerkshireJulie Ethier, PA  Barefoot, Rubbie BattiestLinda Miller 02/11/2013, 8:54 PM   MDM Care assumed from Jannifer RodneyLinda Barefoot, NP Patient waiting for US US is normal per preliminary report Patient reports resolution of lower abdominal pain at this time Reports upper abdominal pain rated at 8/10 at this time. Patient has known gallstones per patient. Upon further investigation of patient's chart history, no record of gallstones Patient now reports significant worsening of abdominal pain and is rolling around in the bed.  Discussed change  in patient status with Dr. Erin Fulling. She feels patient needs assessment by ED physician for possible surgical abdomen.  Discussed patient with Dr. Fonnie Jarvis in [facility]. He is accepting the patient.   Results for orders placed during the hospital encounter of 02/11/13 (from the past 24 hour(s))  CBC     Status: Abnormal   Collection Time    02/11/13 10:55 PM      Result Value Range   WBC 22.1 (*) 4.0 - 10.5 K/uL   RBC 4.02  3.87 - 5.11 MIL/uL   Hemoglobin  10.4 (*) 12.0 - 15.0 g/dL   HCT 09.8 (*) 11.9 - 14.7 %   MCV 76.1 (*) 78.0 - 100.0 fL   MCH 25.9 (*) 26.0 - 34.0 pg   MCHC 34.0  30.0 - 36.0 g/dL   RDW 82.9 (*) 56.2 - 13.0 %   Platelets 292  150 - 400 K/uL  COMPREHENSIVE METABOLIC PANEL     Status: Abnormal   Collection Time    02/11/13 10:55 PM      Result Value Range   Sodium 138  137 - 147 mEq/L   Potassium 3.9  3.7 - 5.3 mEq/L   Chloride 103  96 - 112 mEq/L   CO2 21  19 - 32 mEq/L   Glucose, Bld 107 (*) 70 - 99 mg/dL   BUN 5 (*) 6 - 23 mg/dL   Creatinine, Ser 8.65 (*) 0.50 - 1.10 mg/dL   Calcium 9.0  8.4 - 78.4 mg/dL   Total Protein 6.6  6.0 - 8.3 g/dL   Albumin 3.0 (*) 3.5 - 5.2 g/dL   AST 15  0 - 37 U/L   ALT 15  0 - 35 U/L   Alkaline Phosphatase 63  39 - 117 U/L   Total Bilirubin <0.2 (*) 0.3 - 1.2 mg/dL   GFR calc non Af Amer >90  >90 mL/min   GFR calc Af Amer >90  >90 mL/min  AMYLASE     Status: None   Collection Time    02/11/13 10:55 PM      Result Value Range   Amylase 45  0 - 105 U/L  LIPASE, BLOOD     Status: None   Collection Time    02/11/13 10:55 PM      Result Value Range   Lipase 21  11 - 59 U/L    A: Abdominal pain in pregnancy, antepartum  P: Transfer to [facility] for further evaluation of abdominal pain  Freddi Starr, PA-C 02/12/2013 12:01 AM

## 2013-02-12 ENCOUNTER — Inpatient Hospital Stay (HOSPITAL_COMMUNITY): Payer: Medicaid Other

## 2013-02-12 ENCOUNTER — Encounter (HOSPITAL_COMMUNITY): Payer: Self-pay

## 2013-02-12 LAB — CBC WITH DIFFERENTIAL/PLATELET
BASOS ABS: 0 10*3/uL (ref 0.0–0.1)
Basophils Relative: 0 % (ref 0–1)
Eosinophils Absolute: 0 10*3/uL (ref 0.0–0.7)
Eosinophils Relative: 0 % (ref 0–5)
HCT: 28.7 % — ABNORMAL LOW (ref 36.0–46.0)
Hemoglobin: 9.8 g/dL — ABNORMAL LOW (ref 12.0–15.0)
LYMPHS PCT: 12 % (ref 12–46)
Lymphs Abs: 2.2 10*3/uL (ref 0.7–4.0)
MCH: 26.3 pg (ref 26.0–34.0)
MCHC: 34.1 g/dL (ref 30.0–36.0)
MCV: 77.2 fL — ABNORMAL LOW (ref 78.0–100.0)
Monocytes Absolute: 1.2 10*3/uL — ABNORMAL HIGH (ref 0.1–1.0)
Monocytes Relative: 7 % (ref 3–12)
NEUTROS ABS: 14.7 10*3/uL — AB (ref 1.7–7.7)
Neutrophils Relative %: 81 % — ABNORMAL HIGH (ref 43–77)
PLATELETS: 268 10*3/uL (ref 150–400)
RBC: 3.72 MIL/uL — ABNORMAL LOW (ref 3.87–5.11)
RDW: 15.4 % (ref 11.5–15.5)
WBC: 18.2 10*3/uL — AB (ref 4.0–10.5)

## 2013-02-12 LAB — URINALYSIS, ROUTINE W REFLEX MICROSCOPIC
Bilirubin Urine: NEGATIVE
GLUCOSE, UA: NEGATIVE mg/dL
Hgb urine dipstick: NEGATIVE
LEUKOCYTES UA: NEGATIVE
NITRITE: NEGATIVE
Protein, ur: NEGATIVE mg/dL
Specific Gravity, Urine: 1.02 (ref 1.005–1.030)
UROBILINOGEN UA: 0.2 mg/dL (ref 0.0–1.0)
pH: 6 (ref 5.0–8.0)

## 2013-02-12 LAB — CLOSTRIDIUM DIFFICILE BY PCR: Toxigenic C. Difficile by PCR: NEGATIVE

## 2013-02-12 MED ORDER — HYDROMORPHONE HCL PF 1 MG/ML IJ SOLN
1.0000 mg | Freq: Once | INTRAMUSCULAR | Status: AC
  Administered 2013-02-12: 1 mg via INTRAVENOUS

## 2013-02-12 MED ORDER — SODIUM CHLORIDE 0.9 % IV BOLUS (SEPSIS): 1000.0000 mL | Freq: Once | INTRAVENOUS | Status: DC

## 2013-02-12 MED ORDER — ONDANSETRON HCL 4 MG/2ML IJ SOLN
4.0000 mg | Freq: Once | INTRAMUSCULAR | Status: AC
  Administered 2013-02-12: 4 mg via INTRAVENOUS
  Filled 2013-02-12: qty 2

## 2013-02-12 MED ORDER — SODIUM CHLORIDE 0.9 % IV SOLN
INTRAVENOUS | Status: DC
  Administered 2013-02-12: 02:00:00 via INTRAVENOUS

## 2013-02-12 MED ORDER — HYDROMORPHONE HCL PF 1 MG/ML IJ SOLN
1.0000 mg | Freq: Once | INTRAMUSCULAR | Status: AC
  Administered 2013-02-12: 1 mg via INTRAVENOUS
  Filled 2013-02-12: qty 1

## 2013-02-12 MED ORDER — HYDROMORPHONE HCL PF 1 MG/ML IJ SOLN
1.0000 mg | Freq: Once | INTRAMUSCULAR | Status: DC
  Filled 2013-02-12: qty 1

## 2013-02-12 NOTE — ED Provider Notes (Signed)
CSN: [id number]     Arrival date & time 02/12/13  0057 History   First MD Initiated Contact with Patient 02/12/13 0058     Chief Complaint  Patient presents with  . Abdominal Pain   (Consider location/radiation/quality/duration/timing/severity/associated sxs/prior Treatment) HPI This is a 23 year old female who is about [redacted] weeks pregnant. She has been having generalized abdominal pain along with abnormal bowel movements since about 4:30 yesterday afternoon. She describes the bowel movements as yellow, mucoid and consisting of small amounts. She states she may have been exposed to C. difficile at work. She denies dysuria, hematuria, vaginal bleeding or vaginal discharge. She has had nausea but no vomiting. [facility] an ultrasound was reportedly performed that showed no pregnancy related problems. She was transported here for further evaluation.  She describes her pain as severe and diffuse. [facility] her pain became more localized to the right upper quadrant. At the present time her pain is diffuse. It is worse with movement or palpation. She is having some aching pain in her legs associated with the abdominal pain.  Past Medical History  Diagnosis Date  . Tachycardia   . Obese   . Constipation    Past Surgical History  Procedure Laterality Date  . Foot surgery    . Tonsillectomy     Family History  Problem Relation Age of Onset  . Diabetes Father   . Asthma Brother   . Hearing loss Son   . Hypertension Maternal Grandmother   . Stroke Maternal Grandmother    History  Substance Use Topics  . Smoking status: Current Every Day Smoker -- 1.00 packs/day    Types: Cigarettes  . Smokeless tobacco: Not on file  . Alcohol Use: No   OB History   Grav Para Term Preterm Abortions TAB SAB Ect Mult Living   4 3 3       3      Review of Systems  All other systems reviewed and are negative.    Allergies  Review of  patient's allergies indicates no known allergies.  Home Medications   Current Outpatient Rx  Name  Route  Sig  Dispense  Refill  . acetaminophen (TYLENOL) 500 MG tablet   Oral   Take 1,000 mg by mouth every 6 (six) hours as needed for headache.         . Prenatal Vit-Fe Fumarate-FA (PRENATAL MULTIVITAMIN) TABS tablet   Oral   Take 1 tablet by mouth daily at 12 noon.          BP 105/54  Pulse 95  Temp(Src) 98.7 F (37.1 C) (Oral)  Resp 18  Ht 5\' 9"  (1.753 m)  Wt 245 lb 6.4 oz (111.313 kg)  BMI 36.22 kg/m2  SpO2 99%  LMP 10/07/2012  Physical Exam General: Well-developed, well-nourished female in apparent discomfort; appearance consistent with age of record HENT: normocephalic; atraumatic Eyes: pupils equal, round and reactive to light; extraocular muscles intact Neck: supple Heart: regular rate and rhythm Lungs: clear to auscultation bilaterally Abdomen: soft; nondistended;  diffusely tender; bowel sounds present Extremities: No deformity; full range of motion; pulses normal Neurologic: Awake, alert and oriented; motor function intact in all extremities and symmetric; no facial droop Skin: Warm and dry Psychiatric: Moaning in pain; banging herself on the forehead due to pain    ED Course  Procedures (including critical care time)  MDM   Nursing notes and vitals signs, including pulse  oximetry, reviewed.  Summary of this visit's results, reviewed by myself:  Labs:  Results for orders placed during the hospital encounter of 02/11/13 (from the past 24 hour(s))  CBC     Status: Abnormal   Collection Time    02/11/13 10:55 PM      Result Value Range   WBC 22.1 (*) 4.0 - 10.5 K/uL   RBC 4.02  3.87 - 5.11 MIL/uL   Hemoglobin 10.4 (*) 12.0 - 15.0 g/dL   HCT 16.1 (*) 09.6 - 04.5 %   MCV 76.1 (*) 78.0 - 100.0 fL   MCH 25.9 (*) 26.0 - 34.0 pg   MCHC 34.0  30.0 - 36.0 g/dL   RDW 40.9 (*) 81.1 - 91.4 %   Platelets 292  150 - 400 K/uL  COMPREHENSIVE METABOLIC  PANEL     Status: Abnormal   Collection Time    02/11/13 10:55 PM      Result Value Range   Sodium 138  137 - 147 mEq/L   Potassium 3.9  3.7 - 5.3 mEq/L   Chloride 103  96 - 112 mEq/L   CO2 21  19 - 32 mEq/L   Glucose, Bld 107 (*) 70 - 99 mg/dL   BUN 5 (*) 6 - 23 mg/dL   Creatinine, Ser 7.82 (*) 0.50 - 1.10 mg/dL   Calcium 9.0  8.4 - 95.6 mg/dL   Total Protein 6.6  6.0 - 8.3 g/dL   Albumin 3.0 (*) 3.5 - 5.2 g/dL   AST 15  0 - 37 U/L   ALT 15  0 - 35 U/L   Alkaline Phosphatase 63  39 - 117 U/L   Total Bilirubin <0.2 (*) 0.3 - 1.2 mg/dL   GFR calc non Af Amer >90  >90 mL/min   GFR calc Af Amer >90  >90 mL/min  AMYLASE     Status: None   Collection Time    02/11/13 10:55 PM      Result Value Range   Amylase 45  0 - 105 U/L  LIPASE, BLOOD     Status: None   Collection Time    02/11/13 10:55 PM      Result Value Range   Lipase 21  11 - 59 U/L  CBC WITH DIFFERENTIAL     Status: Abnormal   Collection Time    02/12/13  1:40 AM      Result Value Range   WBC 18.2 (*) 4.0 - 10.5 K/uL   RBC 3.72 (*) 3.87 - 5.11 MIL/uL   Hemoglobin 9.8 (*) 12.0 - 15.0 g/dL   HCT 21.3 (*) 08.6 - 57.8 %   MCV 77.2 (*) 78.0 - 100.0 fL   MCH 26.3  26.0 - 34.0 pg   MCHC 34.1  30.0 - 36.0 g/dL   RDW 46.9  62.9 - 52.8 %   Platelets 268  150 - 400 K/uL   Neutrophils Relative % 81 (*) 43 - 77 %   Neutro Abs 14.7 (*) 1.7 - 7.7 K/uL   Lymphocytes Relative 12  12 - 46 %   Lymphs Abs 2.2  0.7 - 4.0 K/uL   Monocytes Relative 7  3 - 12 %   Monocytes Absolute 1.2 (*) 0.1 - 1.0 K/uL   Eosinophils Relative 0  0 - 5 %   Eosinophils Absolute 0.0  0.0 - 0.7 K/uL   Basophils Relative 0  0 - 1 %   Basophils Absolute 0.0  0.0 - 0.1 K/uL  URINALYSIS, ROUTINE W REFLEX MICROSCOPIC     Status: Abnormal   Collection Time    02/12/13  3:16 AM      Result Value Range   Color, Urine YELLOW  YELLOW   APPearance CLOUDY (*) CLEAR   Specific Gravity, Urine 1.020  1.005 - 1.030   pH 6.0  5.0 - 8.0   Glucose, UA NEGATIVE   NEGATIVE mg/dL   Hgb urine dipstick NEGATIVE  NEGATIVE   Bilirubin Urine NEGATIVE  NEGATIVE   Ketones, ur >80 (*) NEGATIVE mg/dL   Protein, ur NEGATIVE  NEGATIVE mg/dL   Urobilinogen, UA 0.2  0.0 - 1.0 mg/dL   Nitrite NEGATIVE  NEGATIVE   Leukocytes, UA NEGATIVE  NEGATIVE    Imaging Studies: US Abdomen Complete  02/12/2013   CLINICAL DATA:  Abdominal pain and pregnancy.  EXAM: ULTRASOUND ABDOMEN COMPLETE  COMPARISON:  04/20/2009  FINDINGS: Gallbladder:  No gallstones or wall thickening visualized. No sonographic Murphy sign noted.  Common bile duct:  Diameter: 2 mm  Liver:  No focal lesion identified. Within normal limits in parenchymal echogenicity.  IVC:  No abnormality visualized.  Pancreas:  Visualized portion unremarkable.  Spleen:  Size and appearance within normal limits.  Right Kidney:  Length: 12 cm. Echogenicity within normal limits. No mass or hydronephrosis visualized.  Left Kidney:  Length: 13 cm. Echogenicity within normal limits. No mass or hydronephrosis visualized.  Abdominal aorta:  No aneurysm visualized.  Other findings:  None.  IMPRESSION: Negative abdominal ultrasound.   Electronically Signed   By: Tiburcio Pea M.D.   On: 02/12/2013 03:01   US Abdomen Limited  02/12/2013   CLINICAL DATA:  Abdominal pain with diarrhea.  EXAM: LIMITED ABDOMINAL ULTRASOUND  TECHNIQUE: Wallace Cullens scale imaging of the right lower quadrant was performed to evaluate for suspected appendicitis. Standard imaging planes and graded compression technique were utilized.  COMPARISON:  None.  FINDINGS: The appendix is not visualized.  No fluid dilated bowel or ascites seen in the right lower quadrant.  IMPRESSION: Appendix not visualized.   Electronically Signed   By: Tiburcio Pea M.D.   On: 02/12/2013 03:12   Obstetric ultrasound report from [facility] shows viable intrauterine pregnancy at [redacted]w[redacted]d  without evidence of subchorionic hemorrhage.  5:12 AM Pain controlled with IV ballottement. MRI of the  abdomen pending. Stool sent for Clostridium difficile and culture.  7:00AM Dr. Rosalia Hammers to follow up on MRI results and make disposition.   Hanley Seamen, MD 02/12/13 [phone]

## 2013-02-12 NOTE — ED Notes (Signed)
Pt given ice chips per [place] ok. Pt informed not to eat or drink anything else. Mother at bedside.

## 2013-02-12 NOTE — Progress Notes (Signed)
MFM ultrasound  Indication: 23 yr old G4P3003 at 6618w4d with abdominal pain for limited ultrasound. Remote read.  Findings: 1. Single intrauterine pregnancy. 2. Biparietal diameter is consistent with dating. 3. Anterior placenta without evidence of previa. No retroplacental bleed is seen. 4. Normal amniotic fluid volume. 5. No adnexal masses seen.  Recommendations: 1. Appropriate dating. 2. Abdominal pain: - no retroplacental bleed - recommend evaluation of the cervical length given abdominal pain 3. Recommend fetal anatomic survey as soon as possible 4. Normal adnexa  Eulis FosterKristen Gracelynn Bircher, MD

## 2013-02-12 NOTE — Discharge Instructions (Signed)
Please continue oral hydration as we discussed with Gatorade mixed with water and then gradually advance your diet as we discussed. Please recheck with your pregnancy provider in the next 24-48 hours.   Abdominal Pain During Pregnancy Belly (abdominal) pain is common during pregnancy. Most of the time, it is not a serious problem. Other times, it can be a sign that something is wrong with the pregnancy. Always tell your doctor if you have belly pain. HOME CARE Monitor your belly pain for any changes. The following actions may help you feel better:  Do not have sex (intercourse) or put anything in your vagina until you feel better.  Rest until your pain stops.  Drink clear fluids if you feel sick to your stomach (nauseous). Do not eat solid food until you feel better.  Only take medicine as told by your doctor.  Keep all doctor visits as told. GET HELP RIGHT AWAY IF:   You are bleeding, leaking fluid, or pieces of tissue come out of your vagina.  You have more pain or cramping.  You keep throwing up (vomiting).  You have pain when you pee (urinate) or have blood in your pee.  You have a fever.  You do not feel your baby moving as much.  You feel very weak or feel like passing out.  You have trouble breathing, with or without belly pain.  You have a very bad headache and belly pain.  You have fluid leaking from your vagina and belly pain.  You keep having watery poop (diarrhea).  Your belly pain does not go away after resting, or the pain gets worse. MAKE SURE YOU:   Understand these instructions.  Will watch your condition.  Will get help right away if you are not doing well or get worse. Document Released: 12/15/2008 Document Revised: 08/29/2012 Document Reviewed: 07/26/2012 [facility] Patient Information 2014 [place], [place].

## 2013-02-12 NOTE — MAU Provider Note (Signed)
Attestation of Attending Supervision of Fellow: Evaluation and management procedures were performed by the Fellow under my supervision and collaboration.  I have reviewed the Fellow's note and chart, and I agree with the management and plan.    

## 2013-02-12 NOTE — ED Notes (Signed)
Pt able to tolerate fluids w/o difficulty.   

## 2013-02-12 NOTE — ED Notes (Signed)
Received [facility] from [facility]. Pt rocking back and forth. Hitting self, states she is in extreme pain. Dr Donnetta HutchingMopus informed. Pt ambulates to bathroom per pt request.

## 2013-02-12 NOTE — ED Notes (Signed)
Bed: WA09 Expected date:  Expected time:  Means of arrival:  Comments[facility]

## 2013-02-12 NOTE — ED Provider Notes (Addendum)
22 y.o. Female g4p3 seen [facility] complaining of diarrhea.  [facility] that gb normal on us.  Report 18w 2d fetus with fhr 140s by fax.  Patient sent here for mri of abdomen.  Patient's care discussed with Dr.  Read DriversMolpus.  I went to evaluate patient and she is in mri.  Family waiting for patient report she has felt better for the past several hours and wants to take po.  7:13 AM Patient continued to complain of some right-sided flank pain. She has not had any vomiting and has not had any diarrhea since she has been here. I have discussed oral rehydration and need for close followup with patient and mother. I have reviewed her labs and her x-rays. She is [facility] she has any further problems. 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  Hilario Quarryanielle S Maurice Ramseur, MD 02/12/13 [phone]  1- abdominal pain- No definite source found.  MRI and ultrasounds without focal abnormality.  Patient advised.  2- leukocytosis-patient with ongoing leukocytosis since November - likely leukocytosis of pregnancy.   3- diarrhea- patient advised regarding oral hydration.  Patient advised to have close follow up. She voices understanding of above.   Hilario Quarryanielle S Tahmir Kleckner, MD 02/12/13 [phone]

## 2013-02-12 NOTE — ED Notes (Signed)
Departure and care handoff charted in error

## 2013-02-16 LAB — STOOL CULTURE

## 2013-02-18 ENCOUNTER — Inpatient Hospital Stay (HOSPITAL_COMMUNITY)
Admission: AD | Admit: 2013-02-18 | Discharge: 2013-02-18 | Disposition: A | Discharge status: Home / Self Care | Payer: Medicaid Other | Source: Ambulatory Visit | Attending: Obstetrics and Gynecology | Admitting: Obstetrics and Gynecology

## 2013-02-18 ENCOUNTER — Encounter (HOSPITAL_COMMUNITY): Payer: Self-pay | Admitting: *Deleted

## 2013-02-18 DIAGNOSIS — O99891 Other specified diseases and conditions complicating pregnancy: Secondary | ICD-10-CM | POA: Insufficient documentation

## 2013-02-18 DIAGNOSIS — O0930 Supervision of pregnancy with insufficient antenatal care, unspecified trimester: Secondary | ICD-10-CM | POA: Insufficient documentation

## 2013-02-18 DIAGNOSIS — W010XXA Fall on same level from slipping, tripping and stumbling without subsequent striking against object, initial encounter: Secondary | ICD-10-CM | POA: Insufficient documentation

## 2013-02-18 DIAGNOSIS — Y92009 Unspecified place in unspecified non-institutional (private) residence as the place of occurrence of the external cause: Secondary | ICD-10-CM | POA: Insufficient documentation

## 2013-02-18 DIAGNOSIS — R109 Unspecified abdominal pain: Secondary | ICD-10-CM | POA: Insufficient documentation

## 2013-02-18 DIAGNOSIS — R1084 Generalized abdominal pain: Secondary | ICD-10-CM

## 2013-02-18 DIAGNOSIS — S3991XA Unspecified injury of abdomen, initial encounter: Secondary | ICD-10-CM

## 2013-02-18 DIAGNOSIS — O9989 Other specified diseases and conditions complicating pregnancy, childbirth and the puerperium: Principal | ICD-10-CM

## 2013-02-18 DIAGNOSIS — O0932 Supervision of pregnancy with insufficient antenatal care, second trimester: Secondary | ICD-10-CM

## 2013-02-18 HISTORY — DX: Headache: R51

## 2013-02-18 HISTORY — DX: Urinary tract infection, site not specified: N39.0

## 2013-02-18 HISTORY — DX: Anemia, unspecified: D64.9

## 2013-02-18 LAB — URINALYSIS, ROUTINE W REFLEX MICROSCOPIC
Bilirubin Urine: NEGATIVE
GLUCOSE, UA: NEGATIVE mg/dL
HGB URINE DIPSTICK: NEGATIVE
Ketones, ur: 15 mg/dL — AB
Leukocytes, UA: NEGATIVE
Nitrite: NEGATIVE
PROTEIN: NEGATIVE mg/dL
Urobilinogen, UA: 0.2 mg/dL (ref 0.0–1.0)
pH: 6 (ref 5.0–8.0)

## 2013-02-18 MED ORDER — CYCLOBENZAPRINE HCL 10 MG PO TABS: 10.0000 mg | ORAL_TABLET | Freq: Three times a day (TID) | ORAL | Status: DC | PRN

## 2013-02-18 MED ORDER — CYCLOBENZAPRINE HCL 10 MG PO TABS: 10.0000 mg | ORAL_TABLET | Freq: Once | ORAL | Status: DC

## 2013-02-18 NOTE — MAU Note (Signed)
Pt G4 P3 at 19.1wks, fell into the arm of a chair this morning, hit lower abdomen.  Started spotting an hour after the fall.  [facility], pelvic exam performed.  Pt left the hospital. Now pt having lower abdominal pain.

## 2013-02-18 NOTE — Discharge Instructions (Signed)
Second Trimester of Pregnancy The second trimester is from week 13 through week 28, months 4 through 6. The second trimester is often a time when you feel your best. Your body has also adjusted to being pregnant, and you begin to feel better physically. Usually, morning sickness has lessened or quit completely, you may have more energy, and you may have an increase in appetite. The second trimester is also a time when the fetus is growing rapidly. At the end of the sixth month, the fetus is about 9 inches long and weighs about 1 pounds. You will likely begin to feel the baby move (quickening) between 18 and 20 weeks of the pregnancy. BODY CHANGES Your body goes through many changes during pregnancy. The changes vary from woman to woman.   Your weight will continue to increase. You will notice your lower abdomen bulging out.  You may begin to get stretch marks on your hips, abdomen, and breasts.  You may develop headaches that can be relieved by medicines approved by your caregiver.  You may urinate more often because the fetus is pressing on your bladder.  You may develop or continue to have heartburn as a result of your pregnancy.  You may develop constipation because certain hormones are causing the muscles that push waste through your intestines to slow down.  You may develop hemorrhoids or swollen, bulging veins (varicose veins).  You may have back pain because of the weight gain and pregnancy hormones relaxing your joints between the bones in your pelvis and as a result of a shift in weight and the muscles that support your balance.  Your breasts will continue to grow and be tender.  Your gums may bleed and may be sensitive to brushing and flossing.  Dark spots or blotches (chloasma, mask of pregnancy) may develop on your face. This will likely fade after the baby is born.  A dark line from your belly button to the pubic area (linea nigra) may appear. This will likely fade after the  baby is born. WHAT TO EXPECT AT YOUR PRENATAL VISITS During a routine prenatal visit:  You will be weighed to make sure you and the fetus are growing normally.  Your blood pressure will be taken.  Your abdomen will be measured to track your baby's growth.  The fetal heartbeat will be listened to.  Any test results from the previous visit will be discussed. Your caregiver may ask you:  How you are feeling.  If you are feeling the baby move.  If you have had any abnormal symptoms, such as leaking fluid, bleeding, severe headaches, or abdominal cramping.  If you have any questions. Other tests that may be performed during your second trimester include:  Blood tests that check for:  Low iron levels (anemia).  Gestational diabetes (between 24 and 28 weeks).  Rh antibodies.  Urine tests to check for infections, diabetes, or protein in the urine.  An ultrasound to confirm the proper growth and development of the baby.  An amniocentesis to check for possible genetic problems.  Fetal screens for spina bifida and Down syndrome. HOME CARE INSTRUCTIONS   Avoid all smoking, herbs, alcohol, and unprescribed drugs. These chemicals affect the formation and growth of the baby.  Follow your caregiver's instructions regarding medicine use. There are medicines that are either safe or unsafe to take during pregnancy.  Exercise only as directed by your caregiver. Experiencing uterine cramps is a good sign to stop exercising.  Continue to eat regular,   healthy meals.  Wear a good support bra for breast tenderness.  Do not use hot tubs, steam rooms, or saunas.  Wear your seat belt at all times when driving.  Avoid raw meat, uncooked cheese, cat litter boxes, and soil used by cats. These carry germs that can cause birth defects in the baby.  Take your prenatal vitamins.  Try taking a stool softener (if your caregiver approves) if you develop constipation. Eat more high-fiber foods,  such as fresh vegetables or fruit and whole grains. Drink plenty of fluids to keep your urine clear or pale yellow.  Take warm sitz baths to soothe any pain or discomfort caused by hemorrhoids. Use hemorrhoid cream if your caregiver approves.  If you develop varicose veins, wear support hose. Elevate your feet for 15 minutes, 3 4 times a day. Limit salt in your diet.  Avoid heavy lifting, wear low heel shoes, and practice good posture.  Rest with your legs elevated if you have leg cramps or low back pain.  Visit your dentist if you have not gone yet during your pregnancy. Use a soft toothbrush to brush your teeth and be gentle when you floss.  A sexual relationship may be continued unless your caregiver directs you otherwise.  Continue to go to all your prenatal visits as directed by your caregiver. SEEK MEDICAL CARE IF:   You have dizziness.  You have mild pelvic cramps, pelvic pressure, or nagging pain in the abdominal area.  You have persistent nausea, vomiting, or diarrhea.  You have a bad smelling vaginal discharge.  You have pain with urination. SEEK IMMEDIATE MEDICAL CARE IF:   You have a fever.  You are leaking fluid from your vagina.  You have spotting or bleeding from your vagina.  You have severe abdominal cramping or pain.  You have rapid weight gain or loss.  You have shortness of breath with chest pain.  You notice sudden or extreme swelling of your face, hands, ankles, feet, or legs.  You have not felt your baby move in over an hour.  You have severe headaches that do not go away with medicine.  You have vision changes. Document Released: 12/21/2000 Document Revised: 08/29/2012 Document Reviewed: 02/28/2012 ExitCare Patient Information 2014 [employer].  

## 2013-02-18 NOTE — MAU Provider Note (Signed)
  History     CSN: [id number]  Arrival date and time: 02/18/13 2116   First Provider Initiated Contact with Patient 02/18/13 2147      Chief Complaint  Patient presents with  . Abdominal Pain  . Fall   Abdominal Pain  Fall Associated symptoms include abdominal pain.    Joann Fernandez is  23 y.o. [phone] [id number] at 5918w1d who presents today after a fall this morning. She states that at 0830 this morning she fell into the arm of a couch. Since then she has had a lot of abdominal pain along the sides of her abdomen. She states that right after the fall she had one episode of spotting while she wiped. [facility]. She states "I was there all day, and they did not do anything. They did not do an ultrasound". She denies any bleeding at this time. She denies any LOF. [facility]   Past Medical History  Diagnosis Date  . Tachycardia   . Obese   . Constipation   . UTI (lower urinary tract infection)   . Headache(784.0)   . Anemia     Past Surgical History  Procedure Laterality Date  . Foot surgery    . Tonsillectomy      Family History  Problem Relation Age of Onset  . Diabetes Father   . Asthma Brother   . Hearing loss Son   . Hypertension Maternal Grandmother   . Stroke Maternal Grandmother     History  Substance Use Topics  . Smoking status: Current Every Day Smoker -- 1.00 packs/day for 7 years    Types: Cigarettes  . Smokeless tobacco: Not on file  . Alcohol Use: No    Allergies: No Known Allergies  Prescriptions prior to admission  Medication Sig Dispense Refill  . acetaminophen (TYLENOL) 500 MG tablet Take 1,000 mg by mouth every 6 (six) hours as needed for headache.      . Prenatal Vit-Fe Fumarate-FA (PRENATAL MULTIVITAMIN) TABS tablet Take 1 tablet by mouth daily at 12 noon.        Review of Systems  Gastrointestinal: Positive for abdominal pain.   Physical Exam   Blood pressure 148/76, pulse  122, temperature 98.9 F (37.2 C), temperature source Oral, resp. rate 18, height 5\' 9"  (1.753 m), weight 112.583 kg (248 lb 3.2 oz), last menstrual period 10/07/2012.  Physical Exam  Nursing note and vitals reviewed. Constitutional: She is oriented to person, place, and time. She appears well-developed and well-nourished. No distress.  Cardiovascular: Normal rate.   Respiratory: Effort normal.  GI: Soft. There is no tenderness.  No bruising seen.   Genitourinary:   External: no lesion Vagina: small amount of white discharge, no pooling, no blood seen.  Cervix: pink, smooth, no CMT, closed/thick/high Uterus: AGA, +FHT with doppler   Neurological: She is alert and oriented to person, place, and time.  Skin: Skin is warm and dry.  Psychiatric: She has a normal mood and affect.    MAU Course  Procedures   Assessment and Plan   1. Late prenatal care complicating pregnancy in second trimester   2. Abdominal trauma    Bleeding precautions Start [facility] as soon as possible Return to MAU as needed   Tawnya CrookHogan, Declin Rajan Donovan 02/18/2013, 9:58 PM

## 2013-02-19 NOTE — MAU Provider Note (Signed)
Attestation of Attending Supervision of Advanced Practitioner (CNM/NP): Evaluation and management procedures were performed by the Advanced Practitioner under my supervision and collaboration.  I have reviewed the Advanced Practitioner's note and chart, and I agree with the management and plan.  Stancil Deisher 02/19/2013 7:36 AM

## 2013-02-22 ENCOUNTER — Encounter (HOSPITAL_COMMUNITY): Payer: Self-pay | Admitting: Emergency Medicine

## 2013-02-22 ENCOUNTER — Emergency Department (HOSPITAL_COMMUNITY)
Admission: EM | Admit: 2013-02-22 | Discharge: 2013-02-22 | Disposition: A | Discharge status: Home / Self Care | Payer: No Typology Code available for payment source | Attending: Emergency Medicine | Admitting: Emergency Medicine

## 2013-02-22 DIAGNOSIS — O9989 Other specified diseases and conditions complicating pregnancy, childbirth and the puerperium: Secondary | ICD-10-CM | POA: Insufficient documentation

## 2013-02-22 DIAGNOSIS — Y92410 Unspecified street and highway as the place of occurrence of the external cause: Secondary | ICD-10-CM | POA: Insufficient documentation

## 2013-02-22 DIAGNOSIS — S99919A Unspecified injury of unspecified ankle, initial encounter: Secondary | ICD-10-CM

## 2013-02-22 DIAGNOSIS — Z87440 Personal history of urinary (tract) infections: Secondary | ICD-10-CM | POA: Insufficient documentation

## 2013-02-22 DIAGNOSIS — O99330 Smoking (tobacco) complicating pregnancy, unspecified trimester: Secondary | ICD-10-CM | POA: Insufficient documentation

## 2013-02-22 DIAGNOSIS — Y9389 Activity, other specified: Secondary | ICD-10-CM | POA: Insufficient documentation

## 2013-02-22 DIAGNOSIS — E669 Obesity, unspecified: Secondary | ICD-10-CM | POA: Insufficient documentation

## 2013-02-22 DIAGNOSIS — Z79899 Other long term (current) drug therapy: Secondary | ICD-10-CM | POA: Insufficient documentation

## 2013-02-22 DIAGNOSIS — S3981XA Other specified injuries of abdomen, initial encounter: Secondary | ICD-10-CM | POA: Insufficient documentation

## 2013-02-22 DIAGNOSIS — Z3490 Encounter for supervision of normal pregnancy, unspecified, unspecified trimester: Secondary | ICD-10-CM

## 2013-02-22 DIAGNOSIS — Z8719 Personal history of other diseases of the digestive system: Secondary | ICD-10-CM | POA: Insufficient documentation

## 2013-02-22 DIAGNOSIS — O99210 Obesity complicating pregnancy, unspecified trimester: Secondary | ICD-10-CM

## 2013-02-22 DIAGNOSIS — S8990XA Unspecified injury of unspecified lower leg, initial encounter: Secondary | ICD-10-CM | POA: Insufficient documentation

## 2013-02-22 DIAGNOSIS — S99929A Unspecified injury of unspecified foot, initial encounter: Secondary | ICD-10-CM

## 2013-02-22 MED ORDER — IBUPROFEN 600 MG PO TABS: 600.0000 mg | ORAL_TABLET | Freq: Four times a day (QID) | ORAL | Status: DC | PRN
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
## 2013-02-22 NOTE — ED Notes (Signed)
Warm blankets given.

## 2013-02-22 NOTE — Discharge Instructions (Signed)
Take the prescribed medication as directed. Follow-up with your OB-GYN next week as previously scheduled. Return to the ED for new or worsening symptoms-- monitor for vaginal bleeding, loss of vaginal fluid, decreased fetal movement, etc.

## 2013-02-22 NOTE — ED Notes (Signed)
Rapid Response Nurse was here to assess Fetal heart tones.

## 2013-02-26 NOTE — ED Provider Notes (Signed)
Medical screening examination/treatment/procedure(s) were performed by non-physician practitioner and as supervising physician I was immediately available for consultation/collaboration.  EKG Interpretation   None         Lateshia Schmoker M Quashaun Lazalde, DO 02/26/13 1804 

## 2013-02-27 ENCOUNTER — Encounter: Payer: Self-pay | Admitting: Advanced Practice Midwife

## 2013-02-27 ENCOUNTER — Ambulatory Visit (HOSPITAL_COMMUNITY)
Admission: RE | Admit: 2013-02-27 | Discharge: 2013-02-27 | Disposition: A | Discharge status: Home / Self Care | Payer: Medicaid Other | Source: Ambulatory Visit | Attending: Advanced Practice Midwife | Admitting: Advanced Practice Midwife

## 2013-02-27 DIAGNOSIS — Z3689 Encounter for other specified antenatal screening: Secondary | ICD-10-CM | POA: Insufficient documentation

## 2013-02-27 DIAGNOSIS — O0932 Supervision of pregnancy with insufficient antenatal care, second trimester: Secondary | ICD-10-CM

## 2013-02-27 DIAGNOSIS — Z3490 Encounter for supervision of normal pregnancy, unspecified, unspecified trimester: Secondary | ICD-10-CM

## 2013-02-28 ENCOUNTER — Encounter: Payer: Self-pay | Admitting: *Deleted

## 2013-03-04 ENCOUNTER — Encounter: Payer: Self-pay | Admitting: *Deleted

## 2013-06-18 ENCOUNTER — Inpatient Hospital Stay (HOSPITAL_COMMUNITY)
Admission: AD | Admit: 2013-06-18 | Discharge: 2013-06-19 | Disposition: A | Discharge status: Home / Self Care | Payer: Medicaid Other | Source: Ambulatory Visit | Attending: Family Medicine | Admitting: Family Medicine

## 2013-06-18 ENCOUNTER — Encounter (HOSPITAL_COMMUNITY): Payer: Self-pay | Admitting: *Deleted

## 2013-06-18 DIAGNOSIS — R000 Tachycardia, unspecified: Secondary | ICD-10-CM | POA: Insufficient documentation

## 2013-06-18 DIAGNOSIS — O99891 Other specified diseases and conditions complicating pregnancy: Secondary | ICD-10-CM | POA: Insufficient documentation

## 2013-06-18 DIAGNOSIS — O479 False labor, unspecified: Secondary | ICD-10-CM | POA: Insufficient documentation

## 2013-06-18 DIAGNOSIS — O9989 Other specified diseases and conditions complicating pregnancy, childbirth and the puerperium: Secondary | ICD-10-CM

## 2013-06-18 NOTE — MAU Note (Signed)
Contractions started this morning-has not timed them. Denies LOF, vag bleeding. +FM. Has tachycardia-goes to [facility].

## 2013-06-19 LAB — URINALYSIS, ROUTINE W REFLEX MICROSCOPIC
Bilirubin Urine: NEGATIVE
GLUCOSE, UA: NEGATIVE mg/dL
Hgb urine dipstick: NEGATIVE
Ketones, ur: NEGATIVE mg/dL
Leukocytes, UA: NEGATIVE
NITRITE: NEGATIVE
Protein, ur: NEGATIVE mg/dL
Urobilinogen, UA: 0.2 mg/dL (ref 0.0–1.0)
pH: 6.5 (ref 5.0–8.0)

## 2013-09-20 ENCOUNTER — Encounter (HOSPITAL_COMMUNITY): Payer: Self-pay | Admitting: *Deleted

## 2013-11-11 ENCOUNTER — Encounter (HOSPITAL_COMMUNITY): Payer: Self-pay | Admitting: *Deleted

## 2016-09-28 ENCOUNTER — Encounter (HOSPITAL_COMMUNITY): Payer: Self-pay

## 2016-09-28 ENCOUNTER — Inpatient Hospital Stay (HOSPITAL_COMMUNITY)
Admission: AD | Admit: 2016-09-28 | Discharge: 2016-09-28 | Discharge status: Against Medical Advice | Payer: Medicaid Other | Source: Ambulatory Visit | Attending: Obstetrics & Gynecology | Admitting: Obstetrics & Gynecology

## 2016-09-28 DIAGNOSIS — Z5321 Procedure and treatment not carried out due to patient leaving prior to being seen by health care provider: Secondary | ICD-10-CM | POA: Diagnosis present

## 2016-09-28 DIAGNOSIS — O209 Hemorrhage in early pregnancy, unspecified: Secondary | ICD-10-CM

## 2016-09-28 LAB — URINALYSIS, ROUTINE W REFLEX MICROSCOPIC
BACTERIA UA: NONE SEEN
Bilirubin Urine: NEGATIVE
Glucose, UA: NEGATIVE mg/dL
Ketones, ur: NEGATIVE mg/dL
Leukocytes, UA: NEGATIVE
Nitrite: NEGATIVE
PROTEIN: NEGATIVE mg/dL
Specific Gravity, Urine: 1.027 (ref 1.005–1.030)
pH: 5 (ref 5.0–8.0)

## 2016-09-28 LAB — POCT PREGNANCY, URINE: PREG TEST UR: POSITIVE — AB

## 2016-09-28 NOTE — MAU Note (Signed)
Registration staffed called to state patient wanted to sign AMA form. Pt signed form after RN discussed risks of leaving.

## 2016-09-28 NOTE — MAU Note (Signed)
Pt states she started having vaginal bleeding that started about an hour ago. Pt states she has some mild cramping-rates 1/10. Pt states she had a +upt at home last month. LMP: 07/30/2016

## 2017-03-09 ENCOUNTER — Encounter (HOSPITAL_BASED_OUTPATIENT_CLINIC_OR_DEPARTMENT_OTHER): Payer: Self-pay

## 2017-03-09 ENCOUNTER — Other Ambulatory Visit: Payer: Self-pay

## 2017-03-09 ENCOUNTER — Emergency Department (HOSPITAL_BASED_OUTPATIENT_CLINIC_OR_DEPARTMENT_OTHER)
Admission: EM | Admit: 2017-03-09 | Discharge: 2017-03-09 | Disposition: A | Discharge status: Home / Self Care | Payer: No Typology Code available for payment source | Attending: Emergency Medicine | Admitting: Emergency Medicine

## 2017-03-09 DIAGNOSIS — S39012A Strain of muscle, fascia and tendon of lower back, initial encounter: Secondary | ICD-10-CM | POA: Diagnosis not present

## 2017-03-09 DIAGNOSIS — S3992XA Unspecified injury of lower back, initial encounter: Secondary | ICD-10-CM | POA: Diagnosis present

## 2017-03-09 DIAGNOSIS — Y939 Activity, unspecified: Secondary | ICD-10-CM | POA: Diagnosis not present

## 2017-03-09 DIAGNOSIS — Y999 Unspecified external cause status: Secondary | ICD-10-CM | POA: Insufficient documentation

## 2017-03-09 DIAGNOSIS — Y929 Unspecified place or not applicable: Secondary | ICD-10-CM | POA: Diagnosis not present

## 2017-03-09 DIAGNOSIS — F17210 Nicotine dependence, cigarettes, uncomplicated: Secondary | ICD-10-CM | POA: Insufficient documentation

## 2017-03-09 MED ORDER — OXYCODONE-ACETAMINOPHEN 5-325 MG PO TABS: 1.0000 | ORAL_TABLET | Freq: Four times a day (QID) | ORAL | 0 refills | Status: DC | PRN

## 2017-03-09 MED ORDER — NAPROXEN 250 MG PO TABS
500.0000 mg | ORAL_TABLET | Freq: Once | ORAL | Status: AC
  Administered 2017-03-09: 500 mg via ORAL
  Filled 2017-03-09: qty 2

## 2017-03-09 MED ORDER — METHOCARBAMOL 500 MG PO TABS: 500.0000 mg | ORAL_TABLET | Freq: Two times a day (BID) | ORAL | 0 refills | Status: DC
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
## 2017-03-09 NOTE — ED Notes (Signed)
Pt verbalizes understanding of d/c instructions and denies any further needs at this time. 

## 2017-03-21 ENCOUNTER — Encounter: Payer: Self-pay | Admitting: Family Medicine

## 2017-03-21 ENCOUNTER — Ambulatory Visit (INDEPENDENT_AMBULATORY_CARE_PROVIDER_SITE_OTHER): Payer: Self-pay | Admitting: Family Medicine

## 2017-03-21 DIAGNOSIS — M545 Low back pain, unspecified: Secondary | ICD-10-CM

## 2017-03-21 MED ORDER — DICLOFENAC SODIUM 75 MG PO TBEC: 75.0000 mg | DELAYED_RELEASE_TABLET | Freq: Two times a day (BID) | ORAL | 1 refills | Status: DC

## 2017-03-21 NOTE — Patient Instructions (Signed)
You have sprained ligaments deep in your lumbar spine. Ok to take tylenol for baseline pain relief (1-2 extra strength tabs 3x/day) Diclofenac twice a day with food for pain and inflammation. Stay as active as possible. Do home exercises and stretches as directed - hold each for 20-30 seconds and do each one three times. Consider massage, chiropractor, physical therapy, and/or acupuncture. Physical therapy has been shown to be helpful while the others have mixed results. Strengthening of low back muscles, abdominal musculature are key for long term pain relief. If not improving, will consider further imaging (MRI). Follow up with me in 1 month.

## 2017-03-22 ENCOUNTER — Encounter: Payer: Self-pay | Admitting: Family Medicine

## 2017-03-22 DIAGNOSIS — M545 Low back pain, unspecified: Secondary | ICD-10-CM | POA: Insufficient documentation
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
## 2020-06-09 ENCOUNTER — Ambulatory Visit: Payer: Self-pay | Admitting: Psychiatry

## 2021-10-02 ENCOUNTER — Ambulatory Visit
Admission: RE | Admit: 2021-10-02 | Discharge: 2021-10-02 | Disposition: A | Discharge status: Home / Self Care | Payer: Medicaid Other | Source: Ambulatory Visit | Attending: Physician Assistant | Admitting: Physician Assistant

## 2021-10-02 VITALS — BP 130/92 | Temp 98.0°F | Resp 18

## 2021-10-02 DIAGNOSIS — M545 Low back pain, unspecified: Secondary | ICD-10-CM | POA: Diagnosis not present

## 2021-10-02 MED ORDER — PREDNISONE 20 MG PO TABS: 40.0000 mg | ORAL_TABLET | Freq: Every day | ORAL | 0 refills | Status: AC

## 2021-10-02 MED ORDER — CYCLOBENZAPRINE HCL 10 MG PO TABS: 10.0000 mg | ORAL_TABLET | Freq: Two times a day (BID) | ORAL | 0 refills | Status: DC | PRN
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
## 2023-10-30 LAB — OB RESULTS CONSOLE HIV ANTIBODY (ROUTINE TESTING): HIV: NONREACTIVE

## 2023-10-30 LAB — OB RESULTS CONSOLE RUBELLA ANTIBODY, IGM: Rubella: IMMUNE

## 2023-10-30 LAB — OB RESULTS CONSOLE GC/CHLAMYDIA
Chlamydia: NEGATIVE
Neisseria Gonorrhea: NEGATIVE

## 2023-10-30 LAB — OB RESULTS CONSOLE HEPATITIS B SURFACE ANTIGEN: Hepatitis B Surface Ag: NEGATIVE

## 2023-10-30 LAB — OB RESULTS CONSOLE RPR: RPR: NONREACTIVE

## 2023-10-30 LAB — HEPATITIS C ANTIBODY: HCV Ab: NEGATIVE

## 2023-10-31 ENCOUNTER — Other Ambulatory Visit: Payer: Self-pay | Admitting: Obstetrics & Gynecology

## 2023-10-31 ENCOUNTER — Telehealth (HOSPITAL_COMMUNITY): Payer: Self-pay

## 2023-10-31 DIAGNOSIS — O99019 Anemia complicating pregnancy, unspecified trimester: Secondary | ICD-10-CM | POA: Insufficient documentation

## 2023-10-31 NOTE — Telephone Encounter (Signed)
 Auth Submission: NO AUTH NEEDED Site of care: Site of care: [facility] Payer: [place] [employer] Medication & CPT/J Code(s) submitted: Venofer (Iron Sucrose) J1756 Diagnosis Code: O99.019, D50.9 Route of submission (phone, fax, portal):  Phone # Fax # Auth type: Buy/Bill HB Units/visits requested: 200mg  x 5 doses Reference number:  Approval from: 10/31/23 to 01/10/24

## 2023-10-31 NOTE — Progress Notes (Signed)
 Order for IV iron infusion  Joann Faulk, DO Attending Obstetrician & Gynecologist, [facility] for [employer], [facility]

## 2023-11-02 ENCOUNTER — Other Ambulatory Visit: Payer: Self-pay

## 2023-11-02 DIAGNOSIS — Z36 Encounter for antenatal screening for chromosomal anomalies: Secondary | ICD-10-CM

## 2023-11-13 ENCOUNTER — Encounter (HOSPITAL_COMMUNITY)
Admission: RE | Admit: 2023-11-13 | Discharge: 2023-11-13 | Disposition: A | Discharge status: Home / Self Care | Source: Ambulatory Visit | Attending: Obstetrics & Gynecology | Admitting: Obstetrics & Gynecology

## 2023-11-13 VITALS — BP 145/97 | HR 116 | Temp 98.0°F | Resp 16

## 2023-11-13 DIAGNOSIS — D509 Iron deficiency anemia, unspecified: Secondary | ICD-10-CM | POA: Diagnosis not present

## 2023-11-13 DIAGNOSIS — Z3A00 Weeks of gestation of pregnancy not specified: Secondary | ICD-10-CM | POA: Insufficient documentation

## 2023-11-13 DIAGNOSIS — O99019 Anemia complicating pregnancy, unspecified trimester: Secondary | ICD-10-CM | POA: Insufficient documentation

## 2023-11-13 MED ORDER — IRON SUCROSE 200 MG IVPB - SIMPLE MED
200.0000 mg | Freq: Once | Status: AC
  Administered 2023-11-13: 200 mg via INTRAVENOUS
  Filled 2023-11-13: qty 200

## 2023-11-15 ENCOUNTER — Encounter (HOSPITAL_COMMUNITY)
Admission: RE | Admit: 2023-11-15 | Discharge: 2023-11-15 | Disposition: A | Discharge status: Home / Self Care | Source: Ambulatory Visit | Attending: Obstetrics & Gynecology | Admitting: Obstetrics & Gynecology

## 2023-11-15 VITALS — BP 144/89 | HR 101 | Temp 98.0°F | Resp 16

## 2023-11-15 DIAGNOSIS — O99019 Anemia complicating pregnancy, unspecified trimester: Secondary | ICD-10-CM | POA: Diagnosis not present

## 2023-11-15 DIAGNOSIS — D509 Iron deficiency anemia, unspecified: Secondary | ICD-10-CM

## 2023-11-15 MED ORDER — IRON SUCROSE 200 MG IVPB - SIMPLE MED
200.0000 mg | Freq: Once | Status: AC
  Administered 2023-11-15: 200 mg via INTRAVENOUS
  Filled 2023-11-15: qty 110

## 2023-11-17 ENCOUNTER — Encounter (HOSPITAL_COMMUNITY)
Admission: RE | Admit: 2023-11-17 | Discharge: 2023-11-17 | Disposition: A | Discharge status: Home / Self Care | Source: Ambulatory Visit | Attending: Obstetrics & Gynecology | Admitting: Obstetrics & Gynecology

## 2023-11-17 VITALS — BP 150/100 | HR 99 | Temp 98.3°F | Resp 16

## 2023-11-17 DIAGNOSIS — D509 Iron deficiency anemia, unspecified: Secondary | ICD-10-CM

## 2023-11-17 DIAGNOSIS — O99019 Anemia complicating pregnancy, unspecified trimester: Secondary | ICD-10-CM | POA: Diagnosis not present

## 2023-11-17 MED ORDER — IRON SUCROSE 200 MG IVPB - SIMPLE MED
200.0000 mg | Freq: Once | Status: AC
  Administered 2023-11-17: 200 mg via INTRAVENOUS
  Filled 2023-11-17: qty 200

## 2023-11-19 ENCOUNTER — Encounter (HOSPITAL_COMMUNITY): Payer: Self-pay

## 2023-11-19 ENCOUNTER — Inpatient Hospital Stay (HOSPITAL_COMMUNITY)
Admission: AD | Admit: 2023-11-19 | Discharge: 2023-11-22 | DRG: 807 | Disposition: A | Attending: Obstetrics & Gynecology | Admitting: Obstetrics & Gynecology

## 2023-11-19 ENCOUNTER — Other Ambulatory Visit: Payer: Self-pay

## 2023-11-19 ENCOUNTER — Inpatient Hospital Stay (HOSPITAL_COMMUNITY)

## 2023-11-19 DIAGNOSIS — O1414 Severe pre-eclampsia complicating childbirth: Secondary | ICD-10-CM | POA: Diagnosis present

## 2023-11-19 DIAGNOSIS — O99334 Smoking (tobacco) complicating childbirth: Secondary | ICD-10-CM | POA: Diagnosis present

## 2023-11-19 DIAGNOSIS — Z3A35 35 weeks gestation of pregnancy: Secondary | ICD-10-CM | POA: Diagnosis not present

## 2023-11-19 DIAGNOSIS — O24420 Gestational diabetes mellitus in childbirth, diet controlled: Secondary | ICD-10-CM | POA: Diagnosis present

## 2023-11-19 DIAGNOSIS — D573 Sickle-cell trait: Secondary | ICD-10-CM | POA: Diagnosis present

## 2023-11-19 DIAGNOSIS — O99324 Drug use complicating childbirth: Secondary | ICD-10-CM | POA: Diagnosis present

## 2023-11-19 DIAGNOSIS — O1493 Unspecified pre-eclampsia, third trimester: Principal | ICD-10-CM

## 2023-11-19 DIAGNOSIS — O09899 Supervision of other high risk pregnancies, unspecified trimester: Secondary | ICD-10-CM

## 2023-11-19 DIAGNOSIS — Z3687 Encounter for antenatal screening for uncertain dates: Secondary | ICD-10-CM

## 2023-11-19 DIAGNOSIS — F1190 Opioid use, unspecified, uncomplicated: Secondary | ICD-10-CM | POA: Diagnosis present

## 2023-11-19 DIAGNOSIS — O99820 Streptococcus B carrier state complicating pregnancy: Secondary | ICD-10-CM | POA: Diagnosis not present

## 2023-11-19 DIAGNOSIS — O0930 Supervision of pregnancy with insufficient antenatal care, unspecified trimester: Secondary | ICD-10-CM

## 2023-11-19 DIAGNOSIS — D509 Iron deficiency anemia, unspecified: Secondary | ICD-10-CM | POA: Diagnosis present

## 2023-11-19 DIAGNOSIS — Z833 Family history of diabetes mellitus: Secondary | ICD-10-CM | POA: Diagnosis not present

## 2023-11-19 DIAGNOSIS — O9902 Anemia complicating childbirth: Secondary | ICD-10-CM | POA: Diagnosis present

## 2023-11-19 DIAGNOSIS — O99214 Obesity complicating childbirth: Secondary | ICD-10-CM | POA: Diagnosis present

## 2023-11-19 DIAGNOSIS — O24419 Gestational diabetes mellitus in pregnancy, unspecified control: Secondary | ICD-10-CM | POA: Diagnosis present

## 2023-11-19 DIAGNOSIS — O1410 Severe pre-eclampsia, unspecified trimester: Secondary | ICD-10-CM | POA: Diagnosis present

## 2023-11-19 DIAGNOSIS — F17210 Nicotine dependence, cigarettes, uncomplicated: Secondary | ICD-10-CM | POA: Diagnosis present

## 2023-11-19 DIAGNOSIS — Z3A34 34 weeks gestation of pregnancy: Secondary | ICD-10-CM

## 2023-11-19 DIAGNOSIS — O24429 Gestational diabetes mellitus in childbirth, unspecified control: Secondary | ICD-10-CM | POA: Diagnosis not present

## 2023-11-19 DIAGNOSIS — Z8249 Family history of ischemic heart disease and other diseases of the circulatory system: Secondary | ICD-10-CM

## 2023-11-19 LAB — COMPREHENSIVE METABOLIC PANEL WITH GFR
ALT: 18 U/L (ref 0–44)
AST: 26 U/L (ref 15–41)
Albumin: 2.3 g/dL — ABNORMAL LOW (ref 3.5–5.0)
Alkaline Phosphatase: 223 U/L — ABNORMAL HIGH (ref 38–126)
Anion gap: 10 (ref 5–15)
BUN: 7 mg/dL (ref 6–20)
CO2: 21 mmol/L — ABNORMAL LOW (ref 22–32)
Calcium: 8.2 mg/dL — ABNORMAL LOW (ref 8.9–10.3)
Chloride: 107 mmol/L (ref 98–111)
Creatinine, Ser: 0.59 mg/dL (ref 0.44–1.00)
GFR, Estimated: >60 mL/min (ref >60)
Glucose, Bld: 103 mg/dL — ABNORMAL HIGH (ref 70–99)
Potassium: 4 mmol/L (ref 3.5–5.1)
Sodium: 138 mmol/L (ref 135–145)
Total Bilirubin: 0.5 mg/dL (ref 0.0–1.2)
Total Protein: 5.7 g/dL — ABNORMAL LOW (ref 6.5–8.1)

## 2023-11-19 LAB — CBC
HCT: 26.8 % — ABNORMAL LOW (ref 36.0–46.0)
Hemoglobin: 7.6 g/dL — ABNORMAL LOW (ref 12.0–15.0)
MCH: 20.6 pg — ABNORMAL LOW (ref 26.0–34.0)
MCHC: 28.4 g/dL — ABNORMAL LOW (ref 30.0–36.0)
MCV: 72.6 fL — ABNORMAL LOW (ref 80.0–100.0)
Platelets: 268 K/uL (ref 150–400)
RBC: 3.69 MIL/uL — ABNORMAL LOW (ref 3.87–5.11)
RDW: 24.1 % — ABNORMAL HIGH (ref 11.5–15.5)
WBC: 14.3 K/uL — ABNORMAL HIGH (ref 4.0–10.5)
nRBC: 0.7 % — ABNORMAL HIGH (ref 0.0–0.2)

## 2023-11-19 LAB — URINALYSIS, ROUTINE W REFLEX MICROSCOPIC
Bilirubin Urine: NEGATIVE
Glucose, UA: NEGATIVE mg/dL
Hgb urine dipstick: NEGATIVE
Ketones, ur: NEGATIVE mg/dL
Leukocytes,Ua: NEGATIVE
Nitrite: NEGATIVE
Protein, ur: 30 mg/dL — AB
Specific Gravity, Urine: 1.016 (ref 1.005–1.030)
pH: 6 (ref 5.0–8.0)

## 2023-11-19 LAB — PROTEIN / CREATININE RATIO, URINE
Creatinine, Urine: 136 mg/dL
Protein Creatinine Ratio: 0.32 mg/mg{creat} — ABNORMAL HIGH (ref 0.00–0.15)
Total Protein, Urine: 43 mg/dL

## 2023-11-19 MED ORDER — LACTATED RINGERS IV SOLN: INTRAVENOUS | Status: AC

## 2023-11-19 MED ORDER — LACTATED RINGERS IV BOLUS
1000.0000 mL | Freq: Once | INTRAVENOUS | Status: AC
  Administered 2023-11-19: 1000 mL via INTRAVENOUS

## 2023-11-19 MED ORDER — SOD CITRATE-CITRIC ACID 500-334 MG/5ML PO SOLN: 30.0000 mL | ORAL | Status: DC | PRN

## 2023-11-19 MED ORDER — LABETALOL HCL 5 MG/ML IV SOLN
40.0000 mg | INTRAVENOUS | Status: DC | PRN
Start: 2023-11-19 — End: 2023-11-22

## 2023-11-19 MED ORDER — FENTANYL CITRATE (PF) 100 MCG/2ML IJ SOLN
50.0000 ug | INTRAMUSCULAR | Status: DC | PRN
  Administered 2023-11-20: 100 ug via INTRAVENOUS
  Administered 2023-11-20: 50 ug via INTRAVENOUS
  Administered 2023-11-20: 100 ug via INTRAVENOUS
  Filled 2023-11-19 (×3): qty 2

## 2023-11-19 MED ORDER — ACETAMINOPHEN-CAFFEINE 500-65 MG PO TABS
2.0000 | ORAL_TABLET | Freq: Once | ORAL | Status: AC
  Administered 2023-11-19: 2 via ORAL
  Filled 2023-11-19: qty 2

## 2023-11-19 MED ORDER — TERBUTALINE SULFATE 1 MG/ML IJ SOLN: 0.2500 mg | Freq: Once | INTRAMUSCULAR | Status: DC | PRN

## 2023-11-19 MED ORDER — MAGNESIUM OXIDE -MG SUPPLEMENT 400 (240 MG) MG PO TABS
200.0000 mg | ORAL_TABLET | Freq: Every day | ORAL | Status: DC
Start: 2023-11-19 — End: 2023-11-20
  Administered 2023-11-19: 200 mg via ORAL
  Filled 2023-11-19 (×2): qty 0.5

## 2023-11-19 MED ORDER — LACTATED RINGERS IV SOLN: 500.0000 mL | INTRAVENOUS | Status: AC | PRN

## 2023-11-19 MED ORDER — LABETALOL HCL 5 MG/ML IV SOLN
20.0000 mg | INTRAVENOUS | Status: DC | PRN
Start: 2023-11-19 — End: 2023-11-22
  Administered 2023-11-19: 20 mg via INTRAVENOUS
  Filled 2023-11-19: qty 4

## 2023-11-19 MED ORDER — MAGNESIUM SULFATE 40 GM/1000ML IV SOLN
2.0000 g/h | INTRAVENOUS | Status: AC
  Administered 2023-11-19 – 2023-11-20 (×2): 2 g/h via INTRAVENOUS
  Filled 2023-11-19 (×2): qty 1000

## 2023-11-19 MED ORDER — LABETALOL HCL 200 MG PO TABS
300.0000 mg | ORAL_TABLET | Freq: Two times a day (BID) | ORAL | Status: DC
  Administered 2023-11-20 – 2023-11-22 (×5): 300 mg via ORAL
  Filled 2023-11-19 (×5): qty 1

## 2023-11-19 MED ORDER — OXYTOCIN BOLUS FROM INFUSION
333.0000 mL | Freq: Once | INTRAVENOUS | Status: AC
  Administered 2023-11-21: 333 mL via INTRAVENOUS

## 2023-11-19 MED ORDER — OXYTOCIN-SODIUM CHLORIDE 30-0.9 UT/500ML-% IV SOLN
1.0000 m[IU]/min | INTRAVENOUS | Status: DC
  Administered 2023-11-20: 2 m[IU]/min via INTRAVENOUS
  Filled 2023-11-19: qty 500

## 2023-11-19 MED ORDER — OXYTOCIN-SODIUM CHLORIDE 30-0.9 UT/500ML-% IV SOLN: 2.5000 [IU]/h | INTRAVENOUS | Status: DC

## 2023-11-19 MED ORDER — MAGNESIUM SULFATE BOLUS VIA INFUSION
4.0000 g | Freq: Once | INTRAVENOUS | Status: AC
  Administered 2023-11-19: 4 g via INTRAVENOUS
  Filled 2023-11-19: qty 1000

## 2023-11-19 MED ORDER — DIPHENHYDRAMINE HCL 50 MG/ML IJ SOLN
25.0000 mg | INTRAMUSCULAR | Status: AC
  Administered 2023-11-19: 25 mg via INTRAVENOUS
  Filled 2023-11-19: qty 1

## 2023-11-19 MED ORDER — ONDANSETRON HCL 4 MG/2ML IJ SOLN: 4.0000 mg | Freq: Four times a day (QID) | INTRAMUSCULAR | Status: DC | PRN

## 2023-11-19 MED ORDER — SODIUM CHLORIDE 0.9 % IV SOLN
5.0000 10*6.[IU] | Freq: Once | INTRAVENOUS | Status: AC
  Administered 2023-11-19: 5 10*6.[IU] via INTRAVENOUS
  Filled 2023-11-19: qty 5

## 2023-11-19 MED ORDER — MISOPROSTOL 25 MCG QUARTER TABLET
25.0000 ug | ORAL_TABLET | Freq: Once | ORAL | Status: AC
  Administered 2023-11-19: 25 ug via VAGINAL
  Filled 2023-11-19: qty 1

## 2023-11-19 MED ORDER — PENICILLIN G POT IN DEXTROSE 60000 UNIT/ML IV SOLN
3.0000 10*6.[IU] | INTRAVENOUS | Status: DC
  Administered 2023-11-20 (×5): 3 10*6.[IU] via INTRAVENOUS
  Filled 2023-11-19 (×7): qty 50

## 2023-11-19 MED ORDER — PROCHLORPERAZINE EDISYLATE 10 MG/2ML IJ SOLN
10.0000 mg | Freq: Once | INTRAMUSCULAR | Status: AC
  Administered 2023-11-19: 10 mg via INTRAVENOUS
  Filled 2023-11-19: qty 2

## 2023-11-19 MED ORDER — HYDRALAZINE HCL 20 MG/ML IJ SOLN
10.0000 mg | INTRAMUSCULAR | Status: DC | PRN
Start: 2023-11-19 — End: 2023-11-22

## 2023-11-19 MED ORDER — MISOPROSTOL 50MCG HALF TABLET
50.0000 ug | ORAL_TABLET | Freq: Once | ORAL | Status: AC
  Administered 2023-11-19: 50 ug via ORAL
  Filled 2023-11-19: qty 1

## 2023-11-19 MED ORDER — LABETALOL HCL 5 MG/ML IV SOLN: 80.0000 mg | INTRAVENOUS | Status: DC | PRN

## 2023-11-19 MED ORDER — LIDOCAINE HCL (PF) 1 % IJ SOLN: 30.0000 mL | INTRAMUSCULAR | Status: DC | PRN

## 2023-11-19 NOTE — H&P (Incomplete)
 OBSTETRIC ADMISSION HISTORY AND PHYSICAL  Joann Fernandez is a 33 y.o. female [phone] with IUP at [redacted]w[redacted]d by LMP presenting for IOL in the setting of preE. She reports +FMs, No LOF, no VB, + blurry vision, + headaches and +peripheral edema, but no upper RUQ pain.  She request BTL for birth control, though she has not signed forms for this.  She received her prenatal care at [facility]   Dating: By LMP --->  Estimated Date of Delivery: 12/22/23  Sono:    @[redacted]w[redacted]d , CWD, normal anatomy, cephalic presentation, anterior placenta, 2342g, 31% EFW   Prenatal History/Complications: Anemia, Sickle Cell Trait  Past Medical History: Past Medical History:  Diagnosis Date  . Anemia   . Constipation   . Headache(784.0)   . Obese   . Tachycardia   . UTI (lower urinary tract infection)     Past Surgical History: Past Surgical History:  Procedure Laterality Date  . FOOT SURGERY    . TONSILLECTOMY      Obstetrical History: OB History     Gravida  6   Para  3   Term  3   Preterm      AB      Living  3      SAB      IAB      Ectopic      Multiple      Live Births              Social History Social History   Socioeconomic History  . Marital status: Single    Spouse name: Not on file  . Number of children: Not on file  . Years of education: Not on file  . Highest education level: Not on file  Occupational History  . Not on file  Tobacco Use  . Smoking status: Every Day    Current packs/day: 1.00    Average packs/day: 1 pack/day for 7.0 years (7.0 ttl pk-yrs)    Types: Cigarettes  . Smokeless tobacco: Never  Substance and Sexual Activity  . Alcohol use: Yes    Comment: occ  . Drug use: No  . Sexual activity: Not on file  Other Topics Concern  . Not on file  Social History Narrative  . Not on file   Social Drivers of Health   Financial Resource Strain: Not on file  Food Insecurity: No Food Insecurity (11/19/2023)   Hunger Vital Sign   . Worried About  Programme Researcher, Broadcasting/film/video in the Last Year: Never true   . Ran Out of Food in the Last Year: Never true  Transportation Needs: No Transportation Needs (11/19/2023)   PRAPARE - Transportation   . Lack of Transportation (Medical): No   . Lack of Transportation (Non-Medical): No  Physical Activity: Not on file  Stress: Not on file  Social Connections: Not on file    Family History: Family History  Problem Relation Age of Onset  . Diabetes Father   . Asthma Brother   . Hearing loss Son   . Hypertension Maternal Grandmother   . Stroke Maternal Grandmother     Allergies: Allergies  Allergen Reactions  . Vicodin [Hydrocodone-Acetaminophen ]     Medications Prior to Admission  Medication Sig Dispense Refill Last Dose/Taking  . cyclobenzaprine  (FLEXERIL ) 10 MG tablet Take 1 tablet (10 mg total) by mouth 2 (two) times daily as needed for muscle spasms. 20 tablet 0 Unknown  . diclofenac  (VOLTAREN ) 75 MG EC tablet Take 1 tablet (75 mg total)  by mouth 2 (two) times daily. 60 tablet 1 Unknown  . methocarbamol  (ROBAXIN ) 500 MG tablet Take 1 tablet (500 mg total) by mouth 2 (two) times daily. 20 tablet 0 Unknown  . oxyCODONE -acetaminophen  (PERCOCET/ROXICET) 5-325 MG tablet Take 1-2 tablets by mouth every 6 (six) hours as needed for severe pain. 15 tablet 0 Unknown     Review of Systems   All systems reviewed and negative except as stated in HPI  Blood pressure 136/77, pulse 88, temperature 98.1 F (36.7 C), temperature source Oral, resp. rate 16, height 5' 8 (1.727 m), weight 114.1 kg, last menstrual period 03/17/2023, SpO2 100%, unknown if currently breastfeeding. General appearance: alert, cooperative, appears stated age, and morbidly obese Lungs: clear to auscultation bilaterally Heart: regular rate and rhythm Abdomen: soft, non-tender; bowel sounds normal Pelvic: 0/thick/ Extremities: pitting edema bilaterally to mid-calf, no sign of DVT Presentation: cephalic (US  today) Fetal  monitoringBaseline: *** bpm, Variability: {fhr variability:31519}, Accelerations: {fhr accel present:31520}, and Decelerations: {FHR DECEL PRESENT:31526} Uterine activity{Uterine contractions:31516}     Prenatal labs: ABO, Rh: --/--/PENDING (11/09 1800) Antibody: PENDING (11/09 1800) Rubella:   RPR:    HBsAg:    HIV:    GBS:      Lab Results  Component Value Date   GBS POSITIVE 07/29/2008   GTT *** Genetic screening  *** Anatomy US  ***  There is no immunization history for the selected administration types on file for this patient.  Prenatal Transfer Tool  Maternal Diabetes: {Maternal Diabetes:3043596} Genetic Screening: {Genetic Screening:20205} Maternal Ultrasounds/Referrals: {Maternal Ultrasounds / Referrals:20211} Fetal Ultrasounds or other Referrals:  {Fetal Ultrasounds or Other Referrals:20213} Maternal Substance Abuse:  {Maternal Substance Abuse:20223} Significant Maternal Medications:  {Significant Maternal Meds:20233} Significant Maternal Lab Results: {Significant Maternal Lab Results:20235} Number of Prenatal Visits:{Prenatal Visits:27860} Maternal Vaccinations:{Maternal Immunizations:31012} Other Comments:  {Other Comments:20251}   Results for orders placed or performed during the hospital encounter of 11/19/23 (from the past 24 hours)  Urinalysis, Routine w reflex microscopic -Urine, Clean Catch   Collection Time: 11/19/23  6:00 PM  Result Value Ref Range   Color, Urine YELLOW YELLOW   APPearance HAZY (A) CLEAR   Specific Gravity, Urine 1.016 1.005 - 1.030   pH 6.0 5.0 - 8.0   Glucose, UA NEGATIVE NEGATIVE mg/dL   Hgb urine dipstick NEGATIVE NEGATIVE   Bilirubin Urine NEGATIVE NEGATIVE   Ketones, ur NEGATIVE NEGATIVE mg/dL   Protein, ur 30 (A) NEGATIVE mg/dL   Nitrite NEGATIVE NEGATIVE   Leukocytes,Ua NEGATIVE NEGATIVE   RBC / HPF 0-5 0 - 5 RBC/hpf   WBC, UA 0-5 0 - 5 WBC/hpf   Bacteria, UA RARE (A) NONE SEEN   Squamous Epithelial / HPF 0-5 0 - 5 /HPF    Mucus PRESENT   Protein / creatinine ratio, urine   Collection Time: 11/19/23  6:00 PM  Result Value Ref Range   Creatinine, Urine 136 mg/dL   Total Protein, Urine 43 mg/dL   Protein Creatinine Ratio 0.32 (H) 0.00 - 0.15 mg/mg[Cre]  Comprehensive metabolic panel   Collection Time: 11/19/23  6:00 PM  Result Value Ref Range   Sodium 138 135 - 145 mmol/L   Potassium 4.0 3.5 - 5.1 mmol/L   Chloride 107 98 - 111 mmol/L   CO2 21 (L) 22 - 32 mmol/L   Glucose, Bld 103 (H) 70 - 99 mg/dL   BUN 7 6 - 20 mg/dL   Creatinine, Ser 9.40 0.44 - 1.00 mg/dL   Calcium 8.2 (L) 8.9 - 10.3 mg/dL  Total Protein 5.7 (L) 6.5 - 8.1 g/dL   Albumin 2.3 (L) 3.5 - 5.0 g/dL   AST 26 15 - 41 U/L   ALT 18 0 - 44 U/L   Alkaline Phosphatase 223 (H) 38 - 126 U/L   Total Bilirubin 0.5 0.0 - 1.2 mg/dL   GFR, Estimated >39 >39 mL/min   Anion gap 10 5 - 15  CBC   Collection Time: 11/19/23  6:00 PM  Result Value Ref Range   WBC 14.3 (H) 4.0 - 10.5 K/uL   RBC 3.69 (L) 3.87 - 5.11 MIL/uL   Hemoglobin 7.6 (L) 12.0 - 15.0 g/dL   HCT 73.1 (L) 63.9 - 53.9 %   MCV 72.6 (L) 80.0 - 100.0 fL   MCH 20.6 (L) 26.0 - 34.0 pg   MCHC 28.4 (L) 30.0 - 36.0 g/dL   RDW 75.8 (H) 88.4 - 84.4 %   Platelets 268 150 - 400 K/uL   nRBC 0.7 (H) 0.0 - 0.2 %  Type and screen [facility]   Collection Time: 11/19/23  6:00 PM  Result Value Ref Range   ABO/RH(D) PENDING    Antibody Screen PENDING    Sample Expiration      11/22/2023,2359 Performed at [facility], [address], [place], [place] [address]     Patient Active Problem List   Diagnosis Date Noted  . Preeclampsia, severe 11/19/2023  . Iron deficiency anemia during pregnancy 10/31/2023  . Low back pain 03/22/2017  . Late prenatal care complicating pregnancy in second trimester 01/30/2013    Assessment/Plan:  Joann Fernandez is a 33 y.o. [phone] at [redacted]w[redacted]d here for***  #Labor:*** #Pain: *** #FWB: *** #GBS status:  {gen pos  wzh:684356} #Feeding: {Infant feeding:32067} #Reproductive Life planning: {Contraceptives:21111124} #Circ:  {yes/no/default n/a:21102::not applicable}  Joann FORBES Aline, MD  11/19/2023, 11:23 PM

## 2023-11-19 NOTE — H&P (Incomplete)
 OBSTETRIC ADMISSION HISTORY AND PHYSICAL  Joann Fernandez is a 33 y.o. female [phone] with IUP at [redacted]w[redacted]d by LMP presenting for IOL in the setting of preE.  Also with presumed GDM by 1hr glucola 190. She reports +FMs, No LOF, no VB, + blurry vision, + headaches and +peripheral edema, but no upper RUQ pain.  She request BTL for birth control, though she has not signed forms for this.  Patient has been having 2 weeks of increased swelling of her legs, and today she began to have an intractable migraine and cloudy vision.   She received her prenatal care at [facility] as noted by a single visit at 32wks.  Dating: By LMP --->  Estimated Date of Delivery: 12/22/23  Sono:    @[redacted]w[redacted]d , CWD, normal anatomy, cephalic presentation, anterior placenta, 2342g, 31% EFW   Prenatal History/Complications: Anemia, Sickle Cell Trait  Past Medical History: Past Medical History:  Diagnosis Date   Anemia    Constipation    Headache(784.0)    Obese    Tachycardia    UTI (lower urinary tract infection)     Past Surgical History: Past Surgical History:  Procedure Laterality Date   FOOT SURGERY     TONSILLECTOMY      Obstetrical History: OB History     Gravida  6   Para  3   Term  3   Preterm      AB      Living  3      SAB      IAB      Ectopic      Multiple      Live Births              Social History Social History   Socioeconomic History   Marital status: Single    Spouse name: Not on file   Number of children: Not on file   Years of education: Not on file   Highest education level: Not on file  Occupational History   Not on file  Tobacco Use   Smoking status: Every Day    Current packs/day: 1.00    Average packs/day: 1 pack/day for 7.0 years (7.0 ttl pk-yrs)    Types: Cigarettes   Smokeless tobacco: Never  Substance and Sexual Activity   Alcohol use: Yes    Comment: occ   Drug use: No   Sexual activity: Not on file  Other Topics Concern   Not on file   Social History Narrative   Not on file   Social Drivers of Health   Financial Resource Strain: Not on file  Food Insecurity: No Food Insecurity (11/19/2023)   Hunger Vital Sign    Worried About Running Out of Food in the Last Year: Never true    Ran Out of Food in the Last Year: Never true  Transportation Needs: No Transportation Needs (11/19/2023)   PRAPARE - Administrator, Civil Service (Medical): No    Lack of Transportation (Non-Medical): No  Physical Activity: Not on file  Stress: Not on file  Social Connections: Not on file    Family History: Family History  Problem Relation Age of Onset   Diabetes Father    Asthma Brother    Hearing loss Son    Hypertension Maternal Grandmother    Stroke Maternal Grandmother     Allergies: Allergies  Allergen Reactions   Vicodin [Hydrocodone-Acetaminophen ]     Medications Prior to Admission  Medication Sig Dispense Refill Last Dose/Taking  cyclobenzaprine  (FLEXERIL ) 10 MG tablet Take 1 tablet (10 mg total) by mouth 2 (two) times daily as needed for muscle spasms. 20 tablet 0 Unknown   diclofenac  (VOLTAREN ) 75 MG EC tablet Take 1 tablet (75 mg total) by mouth 2 (two) times daily. 60 tablet 1 Unknown   methocarbamol  (ROBAXIN ) 500 MG tablet Take 1 tablet (500 mg total) by mouth 2 (two) times daily. 20 tablet 0 Unknown   oxyCODONE -acetaminophen  (PERCOCET/ROXICET) 5-325 MG tablet Take 1-2 tablets by mouth every 6 (six) hours as needed for severe pain. 15 tablet 0 Unknown     Review of Systems   All systems reviewed and negative except as stated in HPI  Blood pressure 136/77, pulse 88, temperature 98.1 F (36.7 C), temperature source Oral, resp. rate 16, height 5' 8 (1.727 m), weight 114.1 kg, last menstrual period 03/17/2023, SpO2 100%, unknown if currently breastfeeding. General appearance: alert, cooperative, appears stated age, and morbidly obese Lungs: clear to auscultation bilaterally Heart: regular rate and  rhythm Abdomen: soft, non-tender; bowel sounds normal Pelvic: 0/thick/ Extremities: pitting edema bilaterally to mid-calf, no sign of DVT Presentation: cephalic (US  today) Fetal monitoringBaseline: 140 bpm, Variability: Good {> 6 bpm), Accelerations: Reactive, and Decelerations: Absent Uterine activityNone     Prenatal labs: ABO, Rh: --/--/PENDING (11/09 1800) Antibody: PENDING (11/09 1800) Rubella:  immune RPR:   NR HBsAg:   neg HIV:   NR GBS:    unknown  Lab Results  Component Value Date   GBS POSITIVE 07/29/2008   GTT failed 1 hr (glu>190) Genetic screening  NIPS Anatomy US  normal  There is no immunization history for the selected administration types on file for this patient.  Prenatal Transfer Tool  Maternal Diabetes: Failed 1hr GTT, has not been treated for GDM Genetic Screening: Normal Maternal Ultrasounds/Referrals: Normal Fetal Ultrasounds or other Referrals:  None Maternal Substance Abuse:  Yes, urine drug screen positive for opiates Significant Maternal Medications:  None Significant Maternal Lab Results: None Number of Prenatal Visits:Less than or equal to 3 verified prenatal visits Maternal Vaccinations:TDap Other Comments:  None   Results for orders placed or performed during the hospital encounter of 11/19/23 (from the past 24 hours)  Urinalysis, Routine w reflex microscopic -Urine, Clean Catch   Collection Time: 11/19/23  6:00 PM  Result Value Ref Range   Color, Urine YELLOW YELLOW   APPearance HAZY (A) CLEAR   Specific Gravity, Urine 1.016 1.005 - 1.030   pH 6.0 5.0 - 8.0   Glucose, UA NEGATIVE NEGATIVE mg/dL   Hgb urine dipstick NEGATIVE NEGATIVE   Bilirubin Urine NEGATIVE NEGATIVE   Ketones, ur NEGATIVE NEGATIVE mg/dL   Protein, ur 30 (A) NEGATIVE mg/dL   Nitrite NEGATIVE NEGATIVE   Leukocytes,Ua NEGATIVE NEGATIVE   RBC / HPF 0-5 0 - 5 RBC/hpf   WBC, UA 0-5 0 - 5 WBC/hpf   Bacteria, UA RARE (A) NONE SEEN   Squamous Epithelial / HPF 0-5 0 -  5 /HPF   Mucus PRESENT   Protein / creatinine ratio, urine   Collection Time: 11/19/23  6:00 PM  Result Value Ref Range   Creatinine, Urine 136 mg/dL   Total Protein, Urine 43 mg/dL   Protein Creatinine Ratio 0.32 (H) 0.00 - 0.15 mg/mg[Cre]  Comprehensive metabolic panel   Collection Time: 11/19/23  6:00 PM  Result Value Ref Range   Sodium 138 135 - 145 mmol/L   Potassium 4.0 3.5 - 5.1 mmol/L   Chloride 107 98 - 111 mmol/L  CO2 21 (L) 22 - 32 mmol/L   Glucose, Bld 103 (H) 70 - 99 mg/dL   BUN 7 6 - 20 mg/dL   Creatinine, Ser 9.40 0.44 - 1.00 mg/dL   Calcium 8.2 (L) 8.9 - 10.3 mg/dL   Total Protein 5.7 (L) 6.5 - 8.1 g/dL   Albumin 2.3 (L) 3.5 - 5.0 g/dL   AST 26 15 - 41 U/L   ALT 18 0 - 44 U/L   Alkaline Phosphatase 223 (H) 38 - 126 U/L   Total Bilirubin 0.5 0.0 - 1.2 mg/dL   GFR, Estimated >39 >39 mL/min   Anion gap 10 5 - 15  CBC   Collection Time: 11/19/23  6:00 PM  Result Value Ref Range   WBC 14.3 (H) 4.0 - 10.5 K/uL   RBC 3.69 (L) 3.87 - 5.11 MIL/uL   Hemoglobin 7.6 (L) 12.0 - 15.0 g/dL   HCT 73.1 (L) 63.9 - 53.9 %   MCV 72.6 (L) 80.0 - 100.0 fL   MCH 20.6 (L) 26.0 - 34.0 pg   MCHC 28.4 (L) 30.0 - 36.0 g/dL   RDW 75.8 (H) 88.4 - 84.4 %   Platelets 268 150 - 400 K/uL   nRBC 0.7 (H) 0.0 - 0.2 %  Type and screen [facility]   Collection Time: 11/19/23  6:00 PM  Result Value Ref Range   ABO/RH(D) PENDING    Antibody Screen PENDING    Sample Expiration      11/22/2023,2359 Performed at [facility], [address], [place], [place] [address]     Patient Active Problem List   Diagnosis Date Noted   Preeclampsia, severe 11/19/2023   Iron deficiency anemia during pregnancy 10/31/2023   Low back pain 03/22/2017   Late prenatal care complicating pregnancy in second trimester 01/30/2013    Assessment/Plan:  Joann Fernandez is a 33 y.o. [id number] at [redacted]w[redacted]d here for IOL in the setting of preE and only 1 prenatal visit.   #Labor: IOL. Started  dCyto  #Pain: Would like to avoid epidural, but open  #FWB: CAT I #GBS status:  Unknown, penicillin prophylaxis started for unknown result #Feeding: Bottle feeding #Reproductive Life planning: BTL, has not signed papers #Circ:  not applicable  #PreE: mag, oral labetolol, IV labetolol protocol. Continue to closely monitor. #UDS Positive for Opiates: Seen at health department, getting UDS here.  #Anemia: Currently hb 7.4, s/p MWF transfusions last week. Have already consented for blood products.    Joann FORBES Aline, MD  11/19/2023, 11:23 PM   CNM attestation:  I have seen and examined this patient; I agree with above documentation in the resident's note.   Joann Fernandez is a 33 y.o. [id number] here for IOL due to severe pre-e; also with presumed GDM; late/limited care  PE: BP (!) 107/58   Pulse 81   Temp 98.2 F (36.8 C) (Oral)   Resp 17   Ht 5' 8 (1.727 m)   Wt 114.1 kg   LMP 03/17/2023   SpO2 100%   BMI 38.24 kg/m  Gen: calm comfortable, NAD Resp: normal effort, no distress Abd: gravid  ROS, labs, PMH reviewed  Plan: -Admit to Labor and Delivery -Plan cx ripening with dual cytotec to start until foley can be placed; progress to Pit/AROM prn -Mag sulfate for severe pre-e by symptoms (H/A and visual changes; P/C 0.32) -Hgb of 7.6 with IV Venofer last week; consented for blood tx as needed; TXA at delivery -CBGs q 4h -Anticipate  vag delivery -SW consult postpartum for PNV x 1  Suzen JONETTA Gentry CNM 11/20/2023, 4:40 AM

## 2023-11-19 NOTE — MAU Note (Signed)
 Joann Fernandez is a 33 y.o. at [redacted]w[redacted]d here in MAU reporting: over the week states her Bps have higher than usual. Ongoing headache that is now a migraine, has tried tylenol  with relief. Reports her vision is cloudy. Reports an increase in swelling in BLE and gets worse throughout the day. Denies any URQ pain. Feels like she is having heart palpitations and pain in her chest that comes and goes, no chest pain currently. Denies any N/V.   Is not on BP meds and has not been taking Aspirin 81mg .   Denies any CTXs, LOF or VB. Reports +FM  Onset of complaint: ongoing  Pain score: HA 7  Back 4   Vitals:   11/19/23 1656  BP: (!) 160/92  Pulse: (!) 102  Resp: 18  Temp: 98 F (36.7 C)  SpO2: 99%     FHT:148 Lab orders placed from triage:  UA

## 2023-11-19 NOTE — MAU Provider Note (Cosign Needed Addendum)
 History     CSN: [id number]  Arrival date and time: 11/19/23 1608   Event Date/Time   First Provider Initiated Contact with Patient 11/19/23 1904      Chief Complaint  Patient presents with   Hypertension   Headache   Blurred Vision   HPI Joann Fernandez is a 33 y.o. year old [id number] female at [redacted]w[redacted]d weeks gestation who presents to MAU reporting her BPs have been higher than usual for the past week.  She reports headache that started yesterday and was controlled by [facility] powders.  She reports the headache is now migraine totally out of control; rated 7/10. She reports her vision is cloudy.  She reports increased swelling in bilateral lower extremities and gets worse throughout the day.  She reports back pain; rated 4/10.  She denies any right upper quadrant pain.  She feels like she is having heart palpitations and pain in her chest that comes and goes; no chest pain currently.  She denies any nausea vomiting.  She is not on blood pressure medication and has not been taking her baby aspirin every day.  She denies any contractions loss of fluid or vaginal bleeding.  She reports positive fetal movement.  She receives prenatal care at [facility]; her next appointment is 11/30/2023.  OB History     Gravida  6   Para  3   Term  3   Preterm      AB      Living  3      SAB      IAB      Ectopic      Multiple      Live Births              Past Medical History:  Diagnosis Date   Anemia    Constipation    Headache(784.0)    Obese    Tachycardia    UTI (lower urinary tract infection)     Past Surgical History:  Procedure Laterality Date   FOOT SURGERY     TONSILLECTOMY      Family History  Problem Relation Age of Onset   Diabetes Father    Asthma Brother    Hearing loss Son    Hypertension Maternal Grandmother    Stroke Maternal Grandmother     Social History   Tobacco Use   Smoking status: Every Day    Current packs/day: 1.00     Average packs/day: 1 pack/day for 7.0 years (7.0 ttl pk-yrs)    Types: Cigarettes   Smokeless tobacco: Never  Substance Use Topics   Alcohol use: Yes    Comment: occ   Drug use: No    Allergies:  Allergies  Allergen Reactions   Vicodin [Hydrocodone-Acetaminophen ]     Medications Prior to Admission  Medication Sig Dispense Refill Last Dose/Taking   cyclobenzaprine  (FLEXERIL ) 10 MG tablet Take 1 tablet (10 mg total) by mouth 2 (two) times daily as needed for muscle spasms. 20 tablet 0 Unknown   diclofenac  (VOLTAREN ) 75 MG EC tablet Take 1 tablet (75 mg total) by mouth 2 (two) times daily. 60 tablet 1 Unknown   methocarbamol  (ROBAXIN ) 500 MG tablet Take 1 tablet (500 mg total) by mouth 2 (two) times daily. 20 tablet 0 Unknown   oxyCODONE -acetaminophen  (PERCOCET/ROXICET) 5-325 MG tablet Take 1-2 tablets by mouth every 6 (six) hours as needed for severe pain. 15 tablet 0 Unknown    Review of Systems  Constitutional: Negative.  HENT: Negative.    Eyes:  Positive for photophobia.  Respiratory: Negative.    Cardiovascular: Negative.   Gastrointestinal: Negative.   Endocrine: Negative.   Genitourinary: Negative.   Musculoskeletal: Negative.   Skin: Negative.   Allergic/Immunologic: Negative.   Neurological:  Positive for headaches.  Hematological: Negative.   Psychiatric/Behavioral:  The patient is nervous/anxious (I'm now getting antsy).    Physical Exam   Patient Vitals for the past 24 hrs:  BP Temp Temp src Pulse Resp SpO2 Height Weight  11/19/23 2141 129/71 -- -- 86 -- -- -- --  11/19/23 2132 131/65 -- -- 81 -- -- -- --  11/19/23 2124 133/76 -- -- 100 -- -- -- --  11/19/23 2112 138/64 -- -- 84 -- -- -- --  11/19/23 2102 (!) 155/74 -- -- 89 -- -- -- --  11/19/23 2050 (!) 145/71 -- -- 89 -- -- -- --  11/19/23 2040 129/74 -- -- 94 -- -- -- --  11/19/23 2030 122/83 -- -- (!) 110 -- -- -- --  11/19/23 2020 131/77 -- -- 90 -- -- -- --  11/19/23 2010 120/63 -- -- 90 -- --  -- --  11/19/23 2000 125/82 -- -- 100 -- 98 % -- --  11/19/23 1958 -- -- -- -- -- 98 % -- --  11/19/23 1950 122/73 -- -- 85 -- 97 % -- --  11/19/23 1940 135/66 -- -- 87 -- 98 % -- --  11/19/23 1936 -- -- -- -- -- 100 % -- --  11/19/23 1930 (!) 141/75 -- -- 97 -- 98 % -- --  11/19/23 1925 -- -- -- -- -- 99 % -- --  11/19/23 1920 126/78 -- -- 68 -- 99 % -- --  11/19/23 1912 139/77 -- -- (!) 104 -- 100 % -- --  11/19/23 1902 137/82 -- -- 85 -- -- -- --  11/19/23 1841 (!) 156/90 -- -- 86 -- 100 % -- --  11/19/23 1831 (!) 147/90 -- -- 89 -- 100 % -- --  11/19/23 1821 (!) 142/76 -- -- 82 -- -- -- --  11/19/23 1801 136/85 -- -- 92 -- -- -- --  11/19/23 1753 (!) 153/101 -- -- 89 -- 99 % -- --  11/19/23 1741 (!) 156/85 -- -- 96 -- -- -- --  11/19/23 1731 (!) 151/96 -- -- 100 18 -- -- --  11/19/23 1723 (!) 166/97 -- -- 100 -- -- -- --  11/19/23 1656 (!) 160/92 98 F (36.7 C) Oral (!) 102 18 99 % 5' 8 (1.727 m) 114.1 kg     Physical Exam Vitals and nursing note reviewed.  Constitutional:      Appearance: Normal appearance. She is obese.  HENT:     Head: Normocephalic and atraumatic.  Cardiovascular:     Rate and Rhythm: Regular rhythm. Tachycardia present.  Pulmonary:     Effort: Pulmonary effort is normal.     Breath sounds: Normal breath sounds.  Abdominal:     Palpations: Abdomen is soft.  Genitourinary:    Comments: Not indicated Musculoskeletal:        General: Normal range of motion.  Skin:    General: Skin is warm and dry.  Neurological:     Mental Status: She is alert and oriented to person, place, and time.  Psychiatric:        Mood and Affect: Mood normal.        Behavior: Behavior normal.  Thought Content: Thought content normal.        Judgment: Judgment normal.    REACTIVE NST - FHR: 145 bpm / moderate variability / accels present / decels absent / TOCO: none MAU Course  Procedures  MDM- HIGH ADMIT TO LD  Start IV Labetalol Protocol Excedrin Tension  H/A 2 caplets LR 1 liter bolus @ 999 ml/hr Benadryl 25 mg IVP Compazine 10 mg IVP  Results for orders placed or performed during the hospital encounter of 11/19/23 (from the past 24 hours)  Urinalysis, Routine w reflex microscopic -Urine, Clean Catch     Status: Abnormal   Collection Time: 11/19/23  6:00 PM  Result Value Ref Range   Color, Urine YELLOW YELLOW   APPearance HAZY (A) CLEAR   Specific Gravity, Urine 1.016 1.005 - 1.030   pH 6.0 5.0 - 8.0   Glucose, UA NEGATIVE NEGATIVE mg/dL   Hgb urine dipstick NEGATIVE NEGATIVE   Bilirubin Urine NEGATIVE NEGATIVE   Ketones, ur NEGATIVE NEGATIVE mg/dL   Protein, ur 30 (A) NEGATIVE mg/dL   Nitrite NEGATIVE NEGATIVE   Leukocytes,Ua NEGATIVE NEGATIVE   RBC / HPF 0-5 0 - 5 RBC/hpf   WBC, UA 0-5 0 - 5 WBC/hpf   Bacteria, UA RARE (A) NONE SEEN   Squamous Epithelial / HPF 0-5 0 - 5 /HPF   Mucus PRESENT   Protein / creatinine ratio, urine     Status: Abnormal   Collection Time: 11/19/23  6:00 PM  Result Value Ref Range   Creatinine, Urine 136 mg/dL   Total Protein, Urine 43 mg/dL   Protein Creatinine Ratio 0.32 (H) 0.00 - 0.15 mg/mg[Cre]  Comprehensive metabolic panel     Status: Abnormal   Collection Time: 11/19/23  6:00 PM  Result Value Ref Range   Sodium 138 135 - 145 mmol/L   Potassium 4.0 3.5 - 5.1 mmol/L   Chloride 107 98 - 111 mmol/L   CO2 21 (L) 22 - 32 mmol/L   Glucose, Bld 103 (H) 70 - 99 mg/dL   BUN 7 6 - 20 mg/dL   Creatinine, Ser 9.40 0.44 - 1.00 mg/dL   Calcium 8.2 (L) 8.9 - 10.3 mg/dL   Total Protein 5.7 (L) 6.5 - 8.1 g/dL   Albumin 2.3 (L) 3.5 - 5.0 g/dL   AST 26 15 - 41 U/L   ALT 18 0 - 44 U/L   Alkaline Phosphatase 223 (H) 38 - 126 U/L   Total Bilirubin 0.5 0.0 - 1.2 mg/dL   GFR, Estimated >39 >39 mL/min   Anion gap 10 5 - 15  CBC     Status: Abnormal   Collection Time: 11/19/23  6:00 PM  Result Value Ref Range   WBC 14.3 (H) 4.0 - 10.5 K/uL   RBC 3.69 (L) 3.87 - 5.11 MIL/uL   Hemoglobin 7.6 (L) 12.0 -  15.0 g/dL   HCT 73.1 (L) 63.9 - 53.9 %   MCV 72.6 (L) 80.0 - 100.0 fL   MCH 20.6 (L) 26.0 - 34.0 pg   MCHC 28.4 (L) 30.0 - 36.0 g/dL   RDW 75.8 (H) 88.4 - 84.4 %   Platelets 268 150 - 400 K/uL   nRBC 0.7 (H) 0.0 - 0.2 %    Report given to and care assumed by Olam Dalton, [facility] @ [address] CNM 11/19/2023, 7:04 PM    Assessment and Plan    I resumed care of this patient at 2000/ reassessed patient she reports HA still  7/10 on pain scale and PC ratio is 0.32 with SRBP's that were treated and spoke with Dr Fredirick [facility] Attending) regarding admission for PreE with SF, Due  to no vs limited PNC ( 1 visit at 11/07/23 at [facility])- Stat growth ultrasound ordered for fetal measurements and dating/presentation  D/W Dr Fredirick ( OB Attending ) after Viveca resulted             ADMISSION   PreE with Severe Features  Will plan for admission and Delivery for PreE with SF Magnesium for seizure ppx, Admit to LD  GBS by PCR  NICU to be notified  Routine admission orders placed   --------------------------------------------------------------------------- Olam Dalton, MSN, [facility], Center for [facility]

## 2023-11-20 ENCOUNTER — Inpatient Hospital Stay (HOSPITAL_COMMUNITY): Admitting: Anesthesiology

## 2023-11-20 ENCOUNTER — Inpatient Hospital Stay (HOSPITAL_COMMUNITY)
Admission: RE | Admit: 2023-11-20 | Discharge: 2023-11-20 | Disposition: A | Discharge status: Home / Self Care | Source: Ambulatory Visit | Attending: Obstetrics & Gynecology | Admitting: Obstetrics & Gynecology

## 2023-11-20 ENCOUNTER — Encounter (HOSPITAL_COMMUNITY): Payer: Self-pay | Admitting: Family Medicine

## 2023-11-20 DIAGNOSIS — O1414 Severe pre-eclampsia complicating childbirth: Secondary | ICD-10-CM

## 2023-11-20 DIAGNOSIS — O99324 Drug use complicating childbirth: Secondary | ICD-10-CM

## 2023-11-20 DIAGNOSIS — O24419 Gestational diabetes mellitus in pregnancy, unspecified control: Secondary | ICD-10-CM | POA: Diagnosis present

## 2023-11-20 DIAGNOSIS — Z3A35 35 weeks gestation of pregnancy: Secondary | ICD-10-CM

## 2023-11-20 DIAGNOSIS — O99820 Streptococcus B carrier state complicating pregnancy: Secondary | ICD-10-CM

## 2023-11-20 DIAGNOSIS — O24429 Gestational diabetes mellitus in childbirth, unspecified control: Secondary | ICD-10-CM

## 2023-11-20 LAB — COMPREHENSIVE METABOLIC PANEL WITH GFR
ALT: 18 U/L (ref 0–44)
AST: 27 U/L (ref 15–41)
Albumin: 2.5 g/dL — ABNORMAL LOW (ref 3.5–5.0)
Alkaline Phosphatase: 240 U/L — ABNORMAL HIGH (ref 38–126)
Anion gap: 12 (ref 5–15)
BUN: <5 mg/dL — ABNORMAL LOW (ref 6–20)
CO2: 20 mmol/L — ABNORMAL LOW (ref 22–32)
Calcium: 7.7 mg/dL — ABNORMAL LOW (ref 8.9–10.3)
Chloride: 106 mmol/L (ref 98–111)
Creatinine, Ser: 0.7 mg/dL (ref 0.44–1.00)
GFR, Estimated: >60 mL/min (ref >60)
Glucose, Bld: 112 mg/dL — ABNORMAL HIGH (ref 70–99)
Potassium: 4.3 mmol/L (ref 3.5–5.1)
Sodium: 138 mmol/L (ref 135–145)
Total Bilirubin: 0.5 mg/dL (ref 0.0–1.2)
Total Protein: 5.9 g/dL — ABNORMAL LOW (ref 6.5–8.1)

## 2023-11-20 LAB — CBC WITH DIFFERENTIAL/PLATELET
Abs Immature Granulocytes: 0.13 K/uL — ABNORMAL HIGH (ref 0.00–0.07)
Basophils Absolute: 0.1 K/uL (ref 0.0–0.1)
Basophils Relative: 1 %
Eosinophils Absolute: 0.1 K/uL (ref 0.0–0.5)
Eosinophils Relative: 1 %
HCT: 27.4 % — ABNORMAL LOW (ref 36.0–46.0)
Hemoglobin: 7.7 g/dL — ABNORMAL LOW (ref 12.0–15.0)
Immature Granulocytes: 1 %
Lymphocytes Relative: 18 %
Lymphs Abs: 2.7 K/uL (ref 0.7–4.0)
MCH: 20.8 pg — ABNORMAL LOW (ref 26.0–34.0)
MCHC: 28.1 g/dL — ABNORMAL LOW (ref 30.0–36.0)
MCV: 74.1 fL — ABNORMAL LOW (ref 80.0–100.0)
Monocytes Absolute: 1.4 K/uL — ABNORMAL HIGH (ref 0.1–1.0)
Monocytes Relative: 9 %
Neutro Abs: 10.8 K/uL — ABNORMAL HIGH (ref 1.7–7.7)
Neutrophils Relative %: 70 %
Platelets: 256 K/uL (ref 150–400)
RBC: 3.7 MIL/uL — ABNORMAL LOW (ref 3.87–5.11)
RDW: 25.8 % — ABNORMAL HIGH (ref 11.5–15.5)
Smear Review: NORMAL
WBC: 15.3 K/uL — ABNORMAL HIGH (ref 4.0–10.5)
nRBC: 0.5 % — ABNORMAL HIGH (ref 0.0–0.2)

## 2023-11-20 LAB — CBC
HCT: 27.4 % — ABNORMAL LOW (ref 36.0–46.0)
Hemoglobin: 7.8 g/dL — ABNORMAL LOW (ref 12.0–15.0)
MCH: 21 pg — ABNORMAL LOW (ref 26.0–34.0)
MCHC: 28.5 g/dL — ABNORMAL LOW (ref 30.0–36.0)
MCV: 73.9 fL — ABNORMAL LOW (ref 80.0–100.0)
Platelets: 259 K/uL (ref 150–400)
RBC: 3.71 MIL/uL — ABNORMAL LOW (ref 3.87–5.11)
RDW: 26 % — ABNORMAL HIGH (ref 11.5–15.5)
WBC: 17.7 K/uL — ABNORMAL HIGH (ref 4.0–10.5)
nRBC: 0.2 % (ref 0.0–0.2)

## 2023-11-20 LAB — RAPID URINE DRUG SCREEN, HOSP PERFORMED
Amphetamines: NOT DETECTED
Barbiturates: NOT DETECTED
Benzodiazepines: NOT DETECTED
Cocaine: NOT DETECTED
Opiates: NOT DETECTED
Tetrahydrocannabinol: NOT DETECTED

## 2023-11-20 LAB — GLUCOSE, CAPILLARY
Glucose-Capillary: 142 mg/dL — ABNORMAL HIGH (ref 70–99)
Glucose-Capillary: 113 mg/dL — ABNORMAL HIGH (ref 70–99)
Glucose-Capillary: 119 mg/dL — ABNORMAL HIGH (ref 70–99)

## 2023-11-20 LAB — PREPARE RBC (CROSSMATCH)

## 2023-11-20 LAB — RPR: RPR Ser Ql: NONREACTIVE

## 2023-11-20 MED ORDER — EPHEDRINE 5 MG/ML INJ: 10.0000 mg | INTRAVENOUS | Status: DC | PRN

## 2023-11-20 MED ORDER — PROCHLORPERAZINE EDISYLATE 10 MG/2ML IJ SOLN
10.0000 mg | Freq: Four times a day (QID) | INTRAMUSCULAR | Status: DC | PRN
  Administered 2023-11-20: 10 mg via INTRAVENOUS
  Filled 2023-11-20 (×2): qty 2

## 2023-11-20 MED ORDER — MISOPROSTOL 50MCG HALF TABLET
50.0000 ug | ORAL_TABLET | Freq: Once | ORAL | Status: AC
  Administered 2023-11-20: 50 ug via ORAL
  Filled 2023-11-20: qty 1

## 2023-11-20 MED ORDER — LACTATED RINGERS IV SOLN
500.0000 mL | Freq: Once | INTRAVENOUS | Status: AC
  Administered 2023-11-20: 500 mL via INTRAVENOUS

## 2023-11-20 MED ORDER — ACETAMINOPHEN-CAFFEINE 500-65 MG PO TABS
2.0000 | ORAL_TABLET | Freq: Once | ORAL | Status: AC
  Administered 2023-11-20: 2 via ORAL
  Filled 2023-11-20: qty 2

## 2023-11-20 MED ORDER — SODIUM CHLORIDE 0.9% IV SOLUTION: Freq: Once | INTRAVENOUS | Status: DC

## 2023-11-20 MED ORDER — SODIUM CHLORIDE 0.9% IV SOLUTION: Freq: Once | INTRAVENOUS | Status: AC

## 2023-11-20 MED ORDER — LACTATED RINGERS AMNIOINFUSION
INTRAVENOUS | Status: DC
  Filled 2023-11-20 (×2): qty 1000

## 2023-11-20 MED ORDER — INSULIN ASPART 100 UNIT/ML IJ SOLN
1.0000 [IU] | Freq: Once | INTRAMUSCULAR | Status: AC
  Administered 2023-11-20: 1 [IU] via SUBCUTANEOUS

## 2023-11-20 MED ORDER — SODIUM CHLORIDE 0.9 % AMNIOINFUSION
INTRAVENOUS | Status: DC
  Filled 2023-11-20 (×2): qty 1000

## 2023-11-20 MED ORDER — LIDOCAINE HCL (PF) 1 % IJ SOLN
INTRAMUSCULAR | Status: DC | PRN
Start: 2023-11-20 — End: 2023-11-20
  Administered 2023-11-20 (×2): 4 mL via EPIDURAL

## 2023-11-20 MED ORDER — FENTANYL-BUPIVACAINE-NACL 0.5-0.125-0.9 MG/250ML-% EP SOLN
12.0000 mL/h | EPIDURAL | Status: DC | PRN
  Administered 2023-11-20: 12 mL/h via EPIDURAL
  Filled 2023-11-20: qty 250

## 2023-11-20 MED ORDER — PHENYLEPHRINE 80 MCG/ML (10ML) SYRINGE FOR IV PUSH (FOR BLOOD PRESSURE SUPPORT): 80.0000 ug | PREFILLED_SYRINGE | INTRAVENOUS | Status: DC | PRN

## 2023-11-20 MED ORDER — DIPHENHYDRAMINE HCL 50 MG/ML IJ SOLN: 12.5000 mg | INTRAMUSCULAR | Status: DC | PRN

## 2023-11-20 MED ORDER — MISOPROSTOL 25 MCG QUARTER TABLET
25.0000 ug | ORAL_TABLET | Freq: Once | ORAL | Status: AC
  Administered 2023-11-20: 25 ug via VAGINAL
  Filled 2023-11-20: qty 1

## 2023-11-20 MED ORDER — DIPHENHYDRAMINE HCL 50 MG/ML IJ SOLN
12.5000 mg | Freq: Once | INTRAMUSCULAR | Status: AC
  Administered 2023-11-20: 12.5 mg via INTRAVENOUS
  Filled 2023-11-20: qty 1

## 2023-11-20 MED ORDER — PHENYLEPHRINE 80 MCG/ML (10ML) SYRINGE FOR IV PUSH (FOR BLOOD PRESSURE SUPPORT)
80.0000 ug | PREFILLED_SYRINGE | INTRAVENOUS | Status: DC | PRN
Start: 2023-11-20 — End: 2023-11-21
  Filled 2023-11-20: qty 10

## 2023-11-20 NOTE — Anesthesia Preprocedure Evaluation (Signed)
 Anesthesia Evaluation  Patient identified by MRN, date of birth, ID band Patient awake    Reviewed: Allergy & Precautions, Patient's Chart, lab work & pertinent test results  History of Anesthesia Complications Negative for: history of anesthetic complications  Airway Mallampati: II  TM Distance: >3 FB Neck ROM: Full    Dental no notable dental hx.    Pulmonary Current Smoker   Pulmonary exam normal        Cardiovascular hypertension (preE), Normal cardiovascular exam     Neuro/Psych  Headaches    GI/Hepatic negative GI ROS, Neg liver ROS,,,  Endo/Other  diabetes, Gestational    Renal/GU negative Renal ROS     Musculoskeletal negative musculoskeletal ROS (+)    Abdominal   Peds  Hematology  (+) Blood dyscrasia (Hgb 7.7), anemia   Anesthesia Other Findings   Reproductive/Obstetrics (+) Pregnancy                              Anesthesia Physical Anesthesia Plan  ASA: 3  Anesthesia Plan: Epidural   Post-op Pain Management:    Induction:   PONV Risk Score and Plan: Treatment may vary due to age or medical condition  Airway Management Planned: Natural Airway  Additional Equipment: Fetal Monitoring  Intra-op Plan:   Post-operative Plan:   Informed Consent: I have reviewed the patients History and Physical, chart, labs and discussed the procedure including the risks, benefits and alternatives for the proposed anesthesia with the patient or authorized representative who has indicated his/her understanding and acceptance.       Plan Discussed with:   Anesthesia Plan Comments:         Anesthesia Quick Evaluation

## 2023-11-20 NOTE — Progress Notes (Signed)
 Labor Progress Note Joann Fernandez is a 33 y.o. [id number] at [redacted]w[redacted]d admitted from the MAU for IOL for PreE with severe features with additional dx of GDM.  S: Introduced myself. Pt having a mild headache. Requesting treatment to get ahead of it. She has been refusing glucose checks. I don't want to do anything that is not going to change what we do.  We had a long discussion regarding maternal blood glucose control during labor is helpful for infant glucose regulation after delivery. Uncontrolled blood glucose during labor can result in more blood glucose checks for baby, require IV infusion of glucose, and NICU time. Discussed that elevated blood glucose levels would prompt treatment with insulin. Pt agreeable to do 2 glucose checks to start. She is requesting a light laboring diet. So will do first glucose check 2hrs postprandial.  Patient agreeable to FB placement--verbally consented. Would like pain medication beforehand.  O:  BP (!) 141/68   Pulse 95   Temp 98.7 F (37.1 C) (Oral)   Resp 17   Ht 5' 8 (1.727 m)   Wt 114.1 kg   LMP 03/17/2023   SpO2 100%   BMI 38.24 kg/m  Lab Results  Component Value Date   HGB 7.6 (L) 11/19/2023    Time: 10:11 AM  FHT: baseline bpm 145, moderate variability, accelerations absent, decelerations none,   Contractions: q irregular 1-4 mins,   CVE: Dilation: 1.5 Effacement (%): 50 Cervical Position: Posterior Station: -3 Presentation: Vertex Exam by:: Dr. Trudy   [name]: 33 y.o. [id number] [redacted]w[redacted]d IOL for  #Labor: Latent Labor Cytotec and IP Foley FB placed with 40ml at 0930 along with 25mcg Vcyto #Pain: IV pain medications #FWB: Category I #GBS unknown>PCN #severe pre-eclampsia: Mg, labs Q12hr, excedrin/compazine for headache #GDM-pt agreeable to 2 glucose checks Q4hrs #Anemia--pt agreeable to blood transfusions   Leeroy KATHEE Trudy, MD 10:11 AM

## 2023-11-20 NOTE — Progress Notes (Signed)
 Labor Progress Note Aviana Shevlin is a 33 y.o. [id number] at [redacted]w[redacted]d presented for IOL in the setting of PreE.   S:  Patient lying in bed facetiming. She previously had worsening migraine which improved after taking additional tylenol /compazine/benadryl.   O:  BP (!) 107/58   Pulse 81   Temp 98.2 F (36.8 C) (Oral)   Resp 17   Ht 5' 8 (1.727 m)   Wt 114.1 kg   LMP 03/17/2023   SpO2 100%   BMI 38.24 kg/m  EFM: 150/mod variability/ -accels, -decels  CVE: Dilation: Closed Effacement (%): Thick Cervical Position: Posterior Presentation: Vertex Exam by:: Rumaldo Potters RN   [name]: 33 y.o. [id number] [redacted]w[redacted]d for IOL in the setting of preE.   #Labor: Cervix remains posterior and closed.  #Pain: Not currently in pain #FWB: CAT I #GBS unknown, on prophylaxis due to prematurity #PreE: labetolol PO 300 BID, labetolol IV protocol, mag  Ardena Gangl E Amzie Sillas, MD 4:15 AM

## 2023-11-20 NOTE — Progress Notes (Cosign Needed)
 Labor Progress Note Joann Fernandez is a 33 y.o. [id number] at [redacted]w[redacted]d presented for IOL in the setting of PreE with SF (HA).   Joann Fernandez:  Patient more comfortable now that she has received her epidural. She consented to a blood transfusion.   O:  BP 130/74   Pulse 94   Temp (!) 97.4 F (36.3 C) (Oral)   Resp 17   Ht 5' 8 (1.727 m)   Wt 114.1 kg   LMP 03/17/2023   SpO2 100%   BMI 38.24 kg/m  EFM: 130/moderate variability/+accles, +occasional early decels  CVE: Dilation: 5 Effacement (%): 50 Cervical Position: Posterior Station: -3 Presentation: Vertex Exam by:: Joann Gamer, RN   [name]: 33 y.o. [id number] [redacted]w[redacted]d for IOL in the setting of PreE with SF.   #Labor: Progressing well. Has been on Pit since 2pm.  #Pain: Epidural in place.  #FWB: CAT I #GBS unknown, PCN # PreE SF: Mag, labetolol 300 BID, labetolol protocol in place #Anemia: consented for transfusion, will give 1 unit now and hold a 2nd on the floor due to high risk PP hemorrhage.   Joann Fernandez E Joann Quakenbush, MD 9:35 PM   Attestation of Supervision of Student:  I confirm that I have verified the information documented in the resident'Joann Fernandez note and that I have also personally reperformed the history, physical exam and all medical decision making activities.  I have verified that all services and findings are accurately documented in this student'Joann Fernandez note; and I agree with management and plan as outlined in the documentation. I have also made any necessary editorial changes.   Joann Fernandez Angles, MD OB Fellow 11/21/2023 2:41 AM

## 2023-11-20 NOTE — Progress Notes (Signed)
 Labor Progress Note Joann Fernandez is a 33 y.o. [id number] at [redacted]w[redacted]d  admitted from the MAU for IOL for PreE with severe features with additional dx of GDM.   S: Pt feeling increased contractions. Using Nitrous. Verbally consented for AROM.   O:  BP 121/79   Pulse 85   Temp 97.9 F (36.6 C) (Oral)   Resp 18   Ht 5' 8 (1.727 m)   Wt 114.1 kg   LMP 03/17/2023   SpO2 100%   BMI 38.24 kg/m  Lab Results  Component Value Date   HGB 7.6 (L) 11/19/2023    Time: 5:58 PM  FHT: baseline bpm 135, moderate variability, accelerations absent, decelerations none,   Contractions: not picking up on toco  CVE: Dilation: 5 Effacement (%): 50 Cervical Position: Posterior Station: -3 Presentation: Vertex Exam by:: Dr. Trudy, MD AROM with clear fluid   [name]: 33 y.o. [id number] [redacted]w[redacted]d IOL for pre-eclampsia with severe features #Labor: Latent Labor AROM, Pitocin, Cytotec, and IP Foley  #Pain: Labor support without medications, IV pain meds, and Nitrous Oxide #FWB: Category I #GBS unknown>on PCN #Pre with severe features: q12hr labs  #GDM: agreed to 2 checks --142 (given 1u), 113  Leeroy KATHEE Trudy, MD 5:58 PM

## 2023-11-20 NOTE — Anesthesia Procedure Notes (Signed)
 Epidural Patient location during procedure: OB Start time: 11/20/2023 8:08 PM End time: 11/20/2023 8:11 PM  Staffing Anesthesiologist: Paul Lamarr BRAVO, MD Performed: anesthesiologist   Preanesthetic Checklist Completed: patient identified, IV checked, risks and benefits discussed, monitors and equipment checked, pre-op evaluation and timeout performed  Epidural Patient position: sitting Prep: DuraPrep and site prepped and draped Patient monitoring: continuous pulse ox, blood pressure and heart rate Approach: midline Location: L3-L4 Injection technique: LOR air  Needle:  Needle type: Tuohy  Needle gauge: 17 G Needle length: 9 cm Needle insertion depth: 8 cm Catheter type: closed end flexible Catheter size: 19 Gauge Catheter at skin depth: 13 cm Test dose: negative and Other (1% lidocaine )  Assessment Events: blood not aspirated, no cerebrospinal fluid, injection not painful, no injection resistance, no paresthesia and negative IV test  Additional Notes Patient identified. Risks, benefits, and alternatives discussed with patient including but not limited to bleeding, infection, nerve damage, paralysis, failed block, incomplete pain control, headache, blood pressure changes, nausea, vomiting, reactions to medication, itching, and postpartum back pain. Confirmed with bedside nurse the patient's most recent platelet count. Confirmed with patient that they are not currently taking any anticoagulation, have any bleeding history, or any family history of bleeding disorders. Patient expressed understanding and wished to proceed. All questions were answered. Sterile technique was used throughout the entire procedure. Please see nursing notes for vital signs.   Crisp LOR after one needle redirection. Test dose was given through epidural catheter and negative prior to continuing to dose epidural or start infusion. Warning signs of high block given to the patient including shortness of  breath, tingling/numbness in hands, complete motor block, or any concerning symptoms with instructions to call for help. Patient was given instructions on fall risk and not to get out of bed. All questions and concerns addressed with instructions to call with any issues or inadequate analgesia.  Reason for block:procedure for pain

## 2023-11-20 NOTE — Progress Notes (Cosign Needed)
 Labor Progress Note Joann Fernandez is a 33 y.o. [id number] at [redacted]w[redacted]d presented for IOL for PreE with SF.   S:  Patient noted to have decels likely with every contraction. Otherwise patient comfortable.   O:  BP 113/61   Pulse 87   Temp 98.1 F (36.7 C) (Oral)   Resp 18   Ht 5' 8 (1.727 m)   Wt 114.1 kg   LMP 03/17/2023   SpO2 96%   BMI 38.24 kg/m  EFM: 145/moderate variability/- accels, +decels (variable)  CVE: Dilation: 5 Effacement (%): 50 Cervical Position: Posterior Station: -3 Presentation: Vertex Exam by:: Lacinda Gamer, RN   [name]: 33 y.o. [id number] [redacted]w[redacted]d for IOL in the setting of PreE with SF.   #Labor: Variable decels noted, patient was repositioned, given fluid bolus, and had an IUPC placed to monitor contractions and to do an amnioinfusion at 2340. Will monitor for improvement.  #Pain: Epidural #FWB: CAT II #GBS unknown, PCN #PreE: Mag, labetolol protocol, held PO labetolol in setting of normal pressures following epidural placement, will monitor  Lavanda Fernandez Aline, MD 11:46 PM   Attestation of Supervision of Student:  I confirm that I have verified the information documented in the resident's note and that I have also personally reperformed the history, physical exam and all medical decision making activities.  I have verified that all services and findings are accurately documented in this student's note; and I agree with management and plan as outlined in the documentation. I have also made any necessary editorial changes.   Barkley LITTIE Angles, MD OB Fellow 11/21/2023 2:44 AM

## 2023-11-20 NOTE — Progress Notes (Signed)
 Patient refusing CBG monitoring. MD aware.

## 2023-11-21 ENCOUNTER — Encounter (HOSPITAL_COMMUNITY): Payer: Self-pay | Admitting: Family Medicine

## 2023-11-21 LAB — GLUCOSE, RANDOM: Glucose, Bld: 130 mg/dL — ABNORMAL HIGH (ref 70–99)

## 2023-11-21 LAB — CBC
HCT: 25.9 % — ABNORMAL LOW (ref 36.0–46.0)
Hemoglobin: 7.7 g/dL — ABNORMAL LOW (ref 12.0–15.0)
MCH: 22 pg — ABNORMAL LOW (ref 26.0–34.0)
MCHC: 29.7 g/dL — ABNORMAL LOW (ref 30.0–36.0)
MCV: 74 fL — ABNORMAL LOW (ref 80.0–100.0)
Platelets: 197 K/uL (ref 150–400)
RBC: 3.5 MIL/uL — ABNORMAL LOW (ref 3.87–5.11)
RDW: 26.3 % — ABNORMAL HIGH (ref 11.5–15.5)
WBC: 21.2 K/uL — ABNORMAL HIGH (ref 4.0–10.5)
nRBC: 0 % (ref 0.0–0.2)

## 2023-11-21 MED ORDER — DIBUCAINE (PERIANAL) 1 % EX OINT: 1.0000 | TOPICAL_OINTMENT | CUTANEOUS | Status: DC | PRN

## 2023-11-21 MED ORDER — DIPHENHYDRAMINE HCL 25 MG PO CAPS: 25.0000 mg | ORAL_CAPSULE | Freq: Four times a day (QID) | ORAL | Status: DC | PRN

## 2023-11-21 MED ORDER — POTASSIUM CHLORIDE 20 MEQ PO PACK
20.0000 meq | PACK | Freq: Every day | ORAL | Status: DC
Start: 2023-11-21 — End: 2023-11-26
  Administered 2023-11-21 – 2023-11-22 (×2): 20 meq via ORAL
  Filled 2023-11-21 (×2): qty 1

## 2023-11-21 MED ORDER — ACETAMINOPHEN 500 MG PO TABS: 1000.0000 mg | ORAL_TABLET | Freq: Four times a day (QID) | ORAL | Status: DC | PRN

## 2023-11-21 MED ORDER — BENZOCAINE-MENTHOL 20-0.5 % EX AERO: 1.0000 | INHALATION_SPRAY | CUTANEOUS | Status: DC | PRN

## 2023-11-21 MED ORDER — FERROUS SULFATE 325 (65 FE) MG PO TABS
325.0000 mg | ORAL_TABLET | Freq: Every day | ORAL | Status: DC
  Administered 2023-11-21 – 2023-11-22 (×2): 325 mg via ORAL
  Filled 2023-11-21 (×2): qty 1

## 2023-11-21 MED ORDER — COCONUT OIL OIL: 1.0000 | TOPICAL_OIL | Status: DC | PRN

## 2023-11-21 MED ORDER — FUROSEMIDE 20 MG PO TABS
20.0000 mg | ORAL_TABLET | Freq: Every day | ORAL | Status: DC
  Administered 2023-11-21 – 2023-11-22 (×2): 20 mg via ORAL
  Filled 2023-11-21 (×2): qty 1

## 2023-11-21 MED ORDER — WITCH HAZEL-GLYCERIN EX PADS: 1.0000 | MEDICATED_PAD | CUTANEOUS | Status: DC | PRN

## 2023-11-21 MED ORDER — TRANEXAMIC ACID-NACL 1000-0.7 MG/100ML-% IV SOLN
INTRAVENOUS | Status: AC
  Filled 2023-11-21: qty 100

## 2023-11-21 MED ORDER — MISOPROSTOL 200 MCG PO TABS
ORAL_TABLET | ORAL | Status: AC
  Filled 2023-11-21: qty 5

## 2023-11-21 MED ORDER — PRENATAL MULTIVITAMIN CH
1.0000 | ORAL_TABLET | Freq: Every day | ORAL | Status: DC
  Administered 2023-11-21 – 2023-11-22 (×2): 1 via ORAL
  Filled 2023-11-21 (×2): qty 1

## 2023-11-21 MED ORDER — TRANEXAMIC ACID-NACL 1000-0.7 MG/100ML-% IV SOLN
1000.0000 mg | INTRAVENOUS | Status: AC
  Administered 2023-11-21: 1000 mg via INTRAVENOUS

## 2023-11-21 MED ORDER — LACTATED RINGERS IV SOLN: INTRAVENOUS | Status: AC

## 2023-11-21 MED ORDER — IBUPROFEN 600 MG PO TABS
600.0000 mg | ORAL_TABLET | Freq: Four times a day (QID) | ORAL | Status: DC
  Administered 2023-11-21 – 2023-11-22 (×4): 600 mg via ORAL
  Filled 2023-11-21 (×5): qty 1

## 2023-11-21 MED ORDER — ZOLPIDEM TARTRATE 5 MG PO TABS: 5.0000 mg | ORAL_TABLET | Freq: Every evening | ORAL | Status: DC | PRN

## 2023-11-21 MED ORDER — NIFEDIPINE ER OSMOTIC RELEASE 30 MG PO TB24
30.0000 mg | ORAL_TABLET | Freq: Every day | ORAL | Status: DC
  Administered 2023-11-21 – 2023-11-22 (×2): 30 mg via ORAL
  Filled 2023-11-21 (×2): qty 1

## 2023-11-21 MED ORDER — ONDANSETRON HCL 4 MG/2ML IJ SOLN: 4.0000 mg | INTRAMUSCULAR | Status: DC | PRN

## 2023-11-21 MED ORDER — SENNOSIDES-DOCUSATE SODIUM 8.6-50 MG PO TABS
2.0000 | ORAL_TABLET | Freq: Every day | ORAL | Status: DC
  Administered 2023-11-22: 2 via ORAL
  Filled 2023-11-21: qty 2

## 2023-11-21 MED ORDER — ONDANSETRON HCL 4 MG PO TABS: 4.0000 mg | ORAL_TABLET | ORAL | Status: DC | PRN

## 2023-11-21 MED ORDER — MISOPROSTOL 200 MCG PO TABS
1000.0000 ug | ORAL_TABLET | Freq: Once | ORAL | Status: AC
  Administered 2023-11-21: 1000 ug via RECTAL

## 2023-11-21 MED ORDER — SIMETHICONE 80 MG PO CHEW: 80.0000 mg | CHEWABLE_TABLET | ORAL | Status: DC | PRN

## 2023-11-21 NOTE — Progress Notes (Signed)
 Post Partum Day 1 Subjective: no complaints, up ad lib, voiding, and tolerating PO  Objective: Blood pressure 139/66, pulse 93, temperature 98.4 F (36.9 C), temperature source Oral, resp. rate 19, height 5' 8 (1.727 m), weight 114.1 kg, last menstrual period 03/17/2023, SpO2 97%, unknown if currently breastfeeding.  Physical Exam:  General: alert, cooperative, and no distress Lochia: appropriate Uterine Fundus: firm Incision:  DVT Evaluation: No evidence of DVT seen on physical exam.  Recent Labs    11/20/23 1939 11/21/23 0443  HGB 7.8* 7.7*  HCT 27.4* 25.9*    Assessment/Plan: Magnesium infusion for 24 hr PP Iron supplement   LOS: 2 days   Lynwood Solomons, MD 11/21/2023, 7:34 AM

## 2023-11-21 NOTE — Progress Notes (Signed)
 This RN walked into patients room at 1100 to give meds and round on patient. RN walked into room and noticed that the patient was not currently in the room and had unhooked herself from her magnesium sulfate infusion. The patients mother was in the room with the infant and stated that the patient had gone down to the cafeteria and would be back shortly. When patient arrived back to unit around 1120 this RN went into the room to give meds and hook her back up to her magnesium infusion. The patient stated that she was done with that stuff. This RN educated patient on the risks of not finishing out her magnesium infusion. Patient still refused infusion, so RN called Dr. Cleatus and made her aware.

## 2023-11-21 NOTE — Plan of Care (Signed)
  Problem: Education: Goal: Knowledge of disease or condition will improve Outcome: Progressing Goal: Knowledge of the prescribed therapeutic regimen will improve Outcome: Progressing   Problem: Fluid Volume: Goal: Peripheral tissue perfusion will improve Outcome: Progressing   Problem: Clinical Measurements: Goal: Complications related to disease process, condition or treatment will be avoided or minimized Outcome: Progressing   Problem: Education: Goal: Knowledge of General Education information will improve Description: Including pain rating scale, medication(s)/side effects and non-pharmacologic comfort measures Outcome: Progressing   Problem: Health Behavior/Discharge Planning: Goal: Ability to manage health-related needs will improve Outcome: Progressing   Problem: Clinical Measurements: Goal: Ability to maintain clinical measurements within normal limits will improve Outcome: Progressing Goal: Will remain free from infection Outcome: Progressing Goal: Diagnostic test results will improve Outcome: Progressing Goal: Respiratory complications will improve Outcome: Progressing Goal: Cardiovascular complication will be avoided Outcome: Progressing   Problem: Nutrition: Goal: Adequate nutrition will be maintained Outcome: Progressing   Problem: Coping: Goal: Level of anxiety will decrease Outcome: Progressing

## 2023-11-21 NOTE — Discharge Summary (Shared)
 Postpartum Discharge Summary  Date of Service updated-11/12     Patient Name: Joann Fernandez DOB: 08-09-1990 MRN: [id number]  Date of admission: 11/19/2023 Delivery date:11/20/2023 Delivering provider: MAGALI BARKLEY CROME Date of discharge: 11/22/2023  Admitting diagnosis: Preeclampsia, severe [O14.10] Intrauterine pregnancy: [redacted]w[redacted]d     Secondary diagnosis:  Principal Problem:   Preeclampsia, severe Active Problems:   Late prenatal care   Iron deficiency anemia during pregnancy   Gestational diabetes  Additional problems: none    Discharge diagnosis: Preterm Pregnancy Delivered, Preeclampsia (severe), GDM A1, and Anemia                                              Post partum procedures:Blood transfusion prior to delivery Augmentation: AROM, Pitocin, Cytotec, and IP Foley Complications: None  Hospital course: Onset of Labor With Vaginal Delivery      33 y.o. yo [id number] at [redacted]w[redacted]d was admitted in Latent Labor on 11/19/2023. Labor course was complicated by low starting hemoglobin level. 1 unit of pRBC's transfused prior to delivery.   Membrane Rupture Time/Date: 5:47 PM,11/20/2023  Delivery Method:Vaginal, Spontaneous Operative Delivery:N/A Episiotomy: None Lacerations:  None Patient had a postpartum course complicated by preeclampsia with severe features.  She was treated with IV magnesium x 24 hours.  She was then transition to Lasix, labetalol and Procardia to manage her blood pressures.  She is ambulating, tolerating a regular diet, passing flatus, and urinating well.  Desires Nexplanon for contraception to be placed as outpatient patient is discharged home in stable condition on 11/22/23.  Newborn Data: Birth date:11/20/2023 Birth time:11:52 PM Gender:Female Living status:Living Apgars:7 ,9  Weight:2180 g  Magnesium Sulfate received: Yes: Seizure prophylaxis BMZ received: No Rhophylac:N/A A (+) MMR:No T-DaP:Offered postpartum Flu: Offered postpartum RSV Vaccine  received: No Transfusion:Yes  Immunizations received: There is no immunization history for the selected administration types on file for this patient.  Physical exam  Vitals:   11/21/23 2034 11/22/23 0027 11/22/23 0323 11/22/23 0752  BP: (!) 124/58 129/72 136/72 137/79  Pulse: 97 85 85 88  Resp: 18 18 18 18   Temp: 97.8 F (36.6 C) 97.8 F (36.6 C)  98 F (36.7 C)  TempSrc: Oral Oral  Oral  SpO2: 99% 99% 100% 100%  Weight:      Height:       General: alert, cooperative, and no distress Lochia: appropriate Uterine Fundus: firm Incision: N/A DVT Evaluation: No evidence of DVT seen on physical exam. Labs: Lab Results  Component Value Date   WBC 21.2 (H) 11/21/2023   HGB 7.7 (L) 11/21/2023   HCT 25.9 (L) 11/21/2023   MCV 74.0 (L) 11/21/2023   PLT 197 11/21/2023      Latest Ref Rng & Units 11/21/2023    4:43 AM  CMP  Glucose 70 - 99 mg/dL 869    Edinburgh Score:     No data to display         No data recorded  After visit meds:  Allergies as of 11/22/2023       Reactions   Vicodin [hydrocodone-acetaminophen ]         Medication List     STOP taking these medications    cyclobenzaprine  10 MG tablet Commonly known as: FLEXERIL        TAKE these medications    acetaminophen  325 MG tablet Commonly known as: Tylenol  Take  2 tablets (650 mg total) by mouth every 6 (six) hours as needed.   furosemide 20 MG tablet Commonly known as: LASIX Take 1 tablet (20 mg total) by mouth daily for 5 days.   ibuprofen  600 MG tablet Commonly known as: ADVIL  Take 1 tablet (600 mg total) by mouth every 6 (six) hours.   labetalol 300 MG tablet Commonly known as: NORMODYNE Take 1 tablet (300 mg total) by mouth 2 (two) times daily.   NIFEdipine 30 MG 24 hr tablet Commonly known as: ADALAT CC Take 1 tablet (30 mg total) by mouth daily.   potassium chloride SA 20 MEQ tablet Commonly known as: KLOR-CON M Take 1 tablet (20 mEq total) by mouth daily.          Discharge home in stable condition Infant Feeding: Bottle Infant Disposition:home with mother Discharge instruction: per After Visit Summary and Postpartum booklet. Activity: Advance as tolerated. Pelvic rest for 6 weeks.  Diet: low salt diet Future Appointments: Desires Nexplanon as outpatient, likely to be placed at health department Future Appointments  Date Time Provider Department Center  12/04/2023  9:15 AM Izell Harari, MD [facility]   Follow up Visit:  Follow-up Information     [facility] Follow up in 1 week(s).   Specialty: Obstetrics and Gynecology Why: Follow up in one week for a blood pressure check, then 4-6wk for a postpartum visit Contact information: [address] [place]  [address] [phone]                Received care at [facility].   11/22/2023 Jennifer M Ozan, DO

## 2023-11-21 NOTE — Lactation Note (Signed)
 This note was copied from a baby's chart. Lactation Consultation Note  Patient Name: Girl Nickia Boesen Unijb'd Date: 11/21/2023 Age:31 hours Reason for consult: Initial assessment  MOB is choosing to formula feed infant only. Please let LC team know if she is in need of any assistance.  Feeding Mother's Current Feeding Choice: Formula  Consult Status Consult Status: Complete    Recardo Hoit BS, IBCLC 11/21/2023, 12:04 AM

## 2023-11-21 NOTE — Anesthesia Postprocedure Evaluation (Signed)
 Anesthesia Post Note  Patient: Joann Fernandez  Procedure(s) Performed: AN AD HOC LABOR EPIDURAL     Patient location during evaluation: Mother Baby Anesthesia Type: Epidural Level of consciousness: awake and alert and oriented Pain management: satisfactory to patient Vital Signs Assessment: post-procedure vital signs reviewed and stable Respiratory status: respiratory function stable Cardiovascular status: stable Postop Assessment: no headache, no backache, epidural receding, patient able to bend at knees, no signs of nausea or vomiting, adequate PO intake and able to ambulate Anesthetic complications: no   No notable events documented.  Last Vitals:  Vitals:   11/21/23 0555 11/21/23 0838  BP: 139/66 (!) 150/69  Pulse: 93 (!) 101  Resp: 19 18  Temp:  (!) 36.4 C  SpO2:  100%    Last Pain:  Vitals:   11/21/23 0838  TempSrc: Oral  PainSc: 0-No pain   Pain Goal:                   Clair Alfieri

## 2023-11-22 ENCOUNTER — Other Ambulatory Visit (HOSPITAL_COMMUNITY): Payer: Self-pay

## 2023-11-22 ENCOUNTER — Encounter (HOSPITAL_COMMUNITY): Payer: Self-pay | Admitting: Obstetrics & Gynecology

## 2023-11-22 ENCOUNTER — Encounter (HOSPITAL_COMMUNITY)

## 2023-11-22 MED ORDER — LIDOCAINE HCL 1 % IJ SOLN: 0.0000 mL | Freq: Once | INTRAMUSCULAR | Status: DC | PRN

## 2023-11-22 MED ORDER — IBUPROFEN 600 MG PO TABS
600.0000 mg | ORAL_TABLET | Freq: Four times a day (QID) | ORAL | 0 refills | Status: AC
  Filled 2023-11-22: qty 30, 8d supply, fill #0

## 2023-11-22 MED ORDER — ETONOGESTREL 68 MG ~~LOC~~ IMPL
68.0000 mg | DRUG_IMPLANT | Freq: Once | SUBCUTANEOUS | Status: DC
  Filled 2023-11-22: qty 1

## 2023-11-22 MED ORDER — ACETAMINOPHEN 325 MG PO TABS
650.0000 mg | ORAL_TABLET | Freq: Four times a day (QID) | ORAL | Status: AC | PRN
Start: 2023-11-22 — End: ?

## 2023-11-22 MED ORDER — LABETALOL HCL 300 MG PO TABS
300.0000 mg | ORAL_TABLET | Freq: Two times a day (BID) | ORAL | 0 refills | Status: AC
  Filled 2023-11-22: qty 60, 30d supply, fill #0

## 2023-11-22 MED ORDER — FUROSEMIDE 20 MG PO TABS
20.0000 mg | ORAL_TABLET | Freq: Every day | ORAL | 0 refills | Status: AC
  Filled 2023-11-22: qty 5, 5d supply, fill #0

## 2023-11-22 MED ORDER — POTASSIUM CHLORIDE CRYS ER 20 MEQ PO TBCR
20.0000 meq | EXTENDED_RELEASE_TABLET | Freq: Every day | ORAL | 0 refills | Status: AC
  Filled 2023-11-22: qty 5, 5d supply, fill #0

## 2023-11-22 MED ORDER — NIFEDIPINE ER 30 MG PO TB24
30.0000 mg | ORAL_TABLET | Freq: Every day | ORAL | 0 refills | Status: AC
  Filled 2023-11-22: qty 30, 30d supply, fill #0

## 2023-11-22 NOTE — Clinical Social Work Maternal (Addendum)
 CLINICAL SOCIAL WORK MATERNAL/CHILD NOTE  Patient Details  Name: Joann Fernandez MRN: [id number] Date of Birth: 12/23/1990  Date:  January 16, 2023  Clinical Social Worker Initiating Note:  Nat Quiet, [place] Date/Time: Initiated:  11/22/23/1015     Child's Name:  Joann Fernandez   Biological Parents:  Mother (Mother: Joann Fernandez 04/15/1992)   Need for Interpreter:  None   Reason for Referral:  Limited Prenatal Care   Address:  [address] [place] [address]    Phone number:  [phone] (home)     Additional phone number:   Household Members/Support Persons (HM/SP):       HM/SP Name Relationship DOB or Age  HM/SP -1        HM/SP -2        HM/SP -3        HM/SP -4        HM/SP -5        HM/SP -6        HM/SP -7        HM/SP -8          Natural Supports (not living in the home):  Parent   Professional Supports: None   Employment: Unemployed  Type of Work:     Education:  Counsellor arranged:    Surveyor, Quantity Resources:  Medicaid   Other Resources:  [facility]   Cultural/Religious Considerations Which May Impact Care:    Strengths:  Ability to meet basic needs  , Home prepared for child  , Pediatrician chosen   Psychotropic Medications:         Pediatrician:    [place] (including Copywriter, [place] and surronding areas)  Pediatrician List:   [employer]    [facility] Other ([facility])  [facility]      Pediatrician Fax Number:    Risk Factors/Current Problems:  Substance Use  , Mental Health Concerns     Cognitive State:  Able to Concentrate  , Alert     Mood/Affect:  Calm  , Comfortable  , Interested     CSW Assessment: CSW received consult for limited prenatal care. CSW met with MOB to offer support and complete assessment.   CSW met with MOB at bedside and introduced CSW role. MOB had visitors at bedside that she introduced as mother  and ten-year old daughter. CSW offered MOB privacy and MOB's family left the room. MOB appeared calm and agreed to completed the assessment. MOB confirmed that the demographic information on hospital file was correct . CSW inquired about MOB's household situation. MOB reported that she lives with her children and declined to share the names of her older children. CSW inquired if FOB was involvement. MOB reported FOB was not involved and declined to share his information. CSW inquired about MOB's support system. MOB identified her mom and children as her primary sources of support.   CSW inquired how MOB felt during the pregnancy. MOB shared that she cried a lot during the pregnancy and expressed that her emotions were appropriate for her current stressors. MOB did not share what those stressors were.CSW inquired about MOB's noted mental health history. MOB acknowledged that she was diagnosed with Bipolar I with manic and depressive symptoms and ADHD. MOB reported that she was going to [facility] and has tried several medications in the past for her bipolar I symptoms which included Lamictal but discontinued the medication because  it made her more impulsive. She reported taking Adderall in 2024 which helped calm her overactive mind. MOB reported that she has been able to manage her mental health by being self-aware of her symptoms. MOB expressed interest in restarting her mental health medication for bipolar symptoms. CSW educated MOB about [facility] walk-in clinic and called the clinic to confirm the medication management process. MOB verbalized understanding the process, and stated that she would establish care this week. CSW encouraged follow up and discussed postpartum mood disorders. CSW inquired if MOB had experienced postpartum depression with her older children. MOB denied experiencing postpartum depression and anxiety symptoms.   CSW provided education regarding the baby blues  period vs. perinatal mood disorders, discussed treatment and gave resources for mental health follow up if concerns arise. CSW recommended MOB completed self-evaluation during the postpartum time period using the New Mom Checklist from Postpartum Progress and encouraged MOB to contact a medical professional if symptoms are noted at any time. CSW assessed MOB for safety. MOB denied SI/HI and DV concerns.   CSW inquired about MOB's limited prenatal care visits. MOB shared that she did not know that she was pregnant until about four months into the pregnancy. CSW informed MOB about the hospital drug screen policy regarding limited prenatal care. CSW informed MOB that her prenatal records indicated a positive drug screen for opiates during her first prenatal care visit. MOB reported that she did not know why her drug screen was positive for opiates. She disclosed that she smoked marijuana sporadicallyduring the pregnancy and denied using any other substance use during the pregnancy. MOB also denied that she taking any medications that would result positive for opiates. CSW informed MOB that CSW will continue to monitor the infant UDS and CDS. If infant's UDS or CDS result positive, CSW will make a report to CPS. MOB verbalized understanding. CSW inquired if MOB had CPS history. MOB denied having CPS history and current CPS involvement.   CSW inquired if MOB had essential items to care for her infant. MOB reported that she all essential items to care for her infant including a bassinet and car seat.CSW provided review of Sudden Infant Death Syndrome (SIDS) precautions. MOB reported that she currently has WIC benefits and had planned to apply for SNAP benefits. MOB reported that she has chosen a pediatrician at [facility] and confirmed that she has transportation. CSW assessed MOB for additional needs. MOB reported none.   -CSW will continue to monitor the infant's UDS and CDS and make a report to  CPS, if warranted.   CSW identifies no further need for intervention and no barriers to discharge at this time.   CSW Plan/Description:  CSW will continue to monitor the infant's UDS and CDS and make a report to CPS, if warranted. No Further Intervention Required/No Barriers to Discharge, Psychosocial Support and Ongoing Assessment of Needs, Perinatal Mood and Anxiety Disorder (PMADs) Education    Nat DELENA Quiet, LCSW 11-Feb-2023, 10:52 AM

## 2023-11-23 LAB — TYPE AND SCREEN
ABO/RH(D): A POS
Antibody Screen: NEGATIVE
Unit division: 0
Unit division: 0
Unit division: 0
Unit division: 0

## 2023-11-23 LAB — BPAM RBC
Blood Product Expiration Date: 202512012359
Blood Product Expiration Date: 202512012359
Blood Product Expiration Date: 202512012359
Blood Product Expiration Date: 202512012359
ISSUE DATE / TIME: 202511102114
ISSUE DATE / TIME: 202511102114
Unit Type and Rh: 6200
Unit Type and Rh: 6200
Unit Type and Rh: 6200
Unit Type and Rh: 6200

## 2023-12-04 ENCOUNTER — Encounter: Admitting: Obstetrics and Gynecology

## 2023-12-05 ENCOUNTER — Telehealth (HOSPITAL_COMMUNITY): Payer: Self-pay | Admitting: *Deleted

## 2023-12-05 ENCOUNTER — Ambulatory Visit

## 2023-12-05 NOTE — Telephone Encounter (Signed)
 12/05/2023  Name: Joann Fernandez MRN: [id number] DOB: 1990-01-20  Reason for Call:  Transition of Care Hospital Discharge Call  Contact Status: Patient Contact Status: Complete  Language assistant needed: Interpreter Mode: Interpreter Not Needed        Follow-Up Questions: Do You Have Any Concerns About Your Health As You Heal From Delivery?: No Do You Have Any Concerns About Your Infants Health?: No  Edinburgh Postnatal Depression Scale:  In the Past 7 Days:    PHQ2-9 Depression Scale:     Discharge Follow-up: Edinburgh score requires follow up?:  (Patient says answers are the same as in the hospital when score was 0, endorses she is doing well emotionally.) Patient was advised of the following resources:: Support Group, Breastfeeding Support Group (declines postpartum group information via email)  Post-discharge interventions: Reviewed Newborn Safe Sleep Practices  Mliss Sieve, RN 12/05/2023 12:01

## 2023-12-06 ENCOUNTER — Ambulatory Visit

## 2023-12-06 ENCOUNTER — Other Ambulatory Visit: Payer: Self-pay
